# Patient Record
Sex: Female | Born: 1955 | Race: Black or African American | Hispanic: No
Health system: Southern US, Community
[De-identification: ages and names within clinical notes are randomized; demographics above are authoritative.]

## PROBLEM LIST (undated history)

## (undated) ENCOUNTER — Encounter

## (undated) ENCOUNTER — Encounter
Attending: Student in an Organized Health Care Education/Training Program | Primary: Student in an Organized Health Care Education/Training Program

## (undated) ENCOUNTER — Telehealth

## (undated) ENCOUNTER — Ambulatory Visit: Payer: MEDICARE

## (undated) ENCOUNTER — Encounter: Attending: Adult Health | Primary: Adult Health

## (undated) ENCOUNTER — Ambulatory Visit
Payer: MEDICARE | Attending: Student in an Organized Health Care Education/Training Program | Primary: Student in an Organized Health Care Education/Training Program

## (undated) ENCOUNTER — Ambulatory Visit

## (undated) ENCOUNTER — Ambulatory Visit: Payer: MEDICARE | Attending: Adult Health | Primary: Adult Health

## (undated) ENCOUNTER — Ambulatory Visit: Payer: MEDICARE | Attending: Internal Medicine | Primary: Internal Medicine

## (undated) ENCOUNTER — Encounter: Attending: Nurse Practitioner | Primary: Nurse Practitioner

## (undated) ENCOUNTER — Inpatient Hospital Stay

## (undated) ENCOUNTER — Telehealth
Attending: Student in an Organized Health Care Education/Training Program | Primary: Student in an Organized Health Care Education/Training Program

## (undated) ENCOUNTER — Telehealth: Attending: Clinical | Primary: Clinical

---
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
## 2000-05-12 ENCOUNTER — Encounter: Payer: Self-pay | Admitting: Family Medicine

## 2004-08-04 LAB — CONVERTED CEMR LAB: Pap Smear: NORMAL

## 2006-09-08 ENCOUNTER — Emergency Department: Payer: Self-pay | Admitting: Emergency Medicine

## 2006-09-08 ENCOUNTER — Other Ambulatory Visit: Payer: Self-pay

## 2007-09-29 ENCOUNTER — Ambulatory Visit: Payer: Self-pay | Admitting: Family Medicine

## 2007-09-29 DIAGNOSIS — C9311 Chronic myelomonocytic leukemia, in remission: Secondary | ICD-10-CM | POA: Insufficient documentation

## 2007-09-29 DIAGNOSIS — R631 Polydipsia: Secondary | ICD-10-CM

## 2007-09-29 DIAGNOSIS — N959 Unspecified menopausal and perimenopausal disorder: Secondary | ICD-10-CM | POA: Insufficient documentation

## 2007-09-29 DIAGNOSIS — I10 Essential (primary) hypertension: Secondary | ICD-10-CM | POA: Insufficient documentation

## 2007-09-29 DIAGNOSIS — F329 Major depressive disorder, single episode, unspecified: Secondary | ICD-10-CM

## 2007-09-29 DIAGNOSIS — C50919 Malignant neoplasm of unspecified site of unspecified female breast: Secondary | ICD-10-CM | POA: Insufficient documentation

## 2007-09-30 ENCOUNTER — Ambulatory Visit: Payer: Self-pay | Admitting: Family Medicine

## 2007-09-30 DIAGNOSIS — R7402 Elevation of levels of lactic acid dehydrogenase (LDH): Secondary | ICD-10-CM | POA: Insufficient documentation

## 2007-09-30 DIAGNOSIS — E7800 Pure hypercholesterolemia, unspecified: Secondary | ICD-10-CM

## 2007-09-30 DIAGNOSIS — R740 Nonspecific elevation of levels of transaminase and lactic acid dehydrogenase [LDH]: Secondary | ICD-10-CM

## 2007-09-30 LAB — CONVERTED CEMR LAB
AST: 76 units/L — ABNORMAL HIGH (ref 0–37)
Bilirubin, Direct: 0.2 mg/dL (ref 0.0–0.3)
CO2: 24 meq/L (ref 19–32)
Chloride: 108 meq/L (ref 96–112)
Cholesterol: 226 mg/dL (ref 0–200)
Creatinine, Ser: 1.2 mg/dL (ref 0.4–1.2)
Direct LDL: 122.3 mg/dL
Glucose, Bld: 101 mg/dL — ABNORMAL HIGH (ref 70–99)
Sodium: 140 meq/L (ref 135–145)
Total Bilirubin: 1.3 mg/dL — ABNORMAL HIGH (ref 0.3–1.2)
Total Protein: 7.8 g/dL (ref 6.0–8.3)

## 2007-10-01 ENCOUNTER — Ambulatory Visit: Payer: Self-pay | Admitting: Family Medicine

## 2007-10-01 ENCOUNTER — Encounter: Payer: Self-pay | Admitting: Family Medicine

## 2007-10-01 ENCOUNTER — Encounter (INDEPENDENT_AMBULATORY_CARE_PROVIDER_SITE_OTHER): Payer: Self-pay | Admitting: Internal Medicine

## 2007-10-01 ENCOUNTER — Other Ambulatory Visit: Admission: RE | Admit: 2007-10-01 | Discharge: 2007-10-01 | Payer: Self-pay | Admitting: Family Medicine

## 2007-10-01 DIAGNOSIS — G4700 Insomnia, unspecified: Secondary | ICD-10-CM

## 2007-10-06 ENCOUNTER — Encounter (INDEPENDENT_AMBULATORY_CARE_PROVIDER_SITE_OTHER): Payer: Self-pay | Admitting: Internal Medicine

## 2007-10-06 ENCOUNTER — Encounter (INDEPENDENT_AMBULATORY_CARE_PROVIDER_SITE_OTHER): Payer: Self-pay | Admitting: *Deleted

## 2007-10-06 ENCOUNTER — Telehealth (INDEPENDENT_AMBULATORY_CARE_PROVIDER_SITE_OTHER): Payer: Self-pay | Admitting: Internal Medicine

## 2007-10-15 ENCOUNTER — Encounter: Payer: Self-pay | Admitting: Family Medicine

## 2007-10-18 ENCOUNTER — Ambulatory Visit: Payer: Self-pay | Admitting: Family Medicine

## 2007-10-18 LAB — CONVERTED CEMR LAB
Hep B C IgM: NEGATIVE
Hepatitis B Surface Ag: NEGATIVE

## 2007-10-19 LAB — CONVERTED CEMR LAB
Alkaline Phosphatase: 70 units/L (ref 39–117)
Total Bilirubin: 0.8 mg/dL (ref 0.3–1.2)
Total Protein: 7.7 g/dL (ref 6.0–8.3)

## 2007-11-01 ENCOUNTER — Encounter (INDEPENDENT_AMBULATORY_CARE_PROVIDER_SITE_OTHER): Payer: Self-pay | Admitting: *Deleted

## 2013-07-18 ENCOUNTER — Ambulatory Visit: Payer: Self-pay | Admitting: Oncology

## 2013-07-18 ENCOUNTER — Inpatient Hospital Stay: Payer: Self-pay | Admitting: Internal Medicine

## 2013-07-18 LAB — CK TOTAL AND CKMB (NOT AT ARMC)
CK, Total: 40 U/L (ref 21–215)
CK-MB: 1.3 ng/mL (ref 0.5–3.6)

## 2013-07-18 LAB — BASIC METABOLIC PANEL
Anion Gap: 16 (ref 7–16)
BUN: 36 mg/dL — ABNORMAL HIGH (ref 7–18)
Calcium, Total: 8.2 mg/dL — ABNORMAL LOW (ref 8.5–10.1)
Chloride: 111 mmol/L — ABNORMAL HIGH (ref 98–107)
Creatinine: 1.43 mg/dL — ABNORMAL HIGH (ref 0.60–1.30)
EGFR (Non-African Amer.): 40 — ABNORMAL LOW
Glucose: 87 mg/dL (ref 65–99)
Potassium: 4.7 mmol/L (ref 3.5–5.1)
Sodium: 138 mmol/L (ref 136–145)

## 2013-07-18 LAB — RETICULOCYTES
Absolute Retic Count: 0.3048 10*6/uL — ABNORMAL HIGH (ref 0.019–0.186)
Reticulocyte: 15.34 % — ABNORMAL HIGH (ref 0.4–3.1)

## 2013-07-18 LAB — APTT: Activated PTT: 36.3 secs — ABNORMAL HIGH (ref 23.6–35.9)

## 2013-07-18 LAB — HEPATIC FUNCTION PANEL A (ARMC)
Albumin: 2.4 g/dL — ABNORMAL LOW (ref 3.4–5.0)
Bilirubin, Direct: 0.7 mg/dL — ABNORMAL HIGH (ref 0.00–0.20)
Bilirubin,Total: 1.6 mg/dL — ABNORMAL HIGH (ref 0.2–1.0)
Total Protein: 5.9 g/dL — ABNORMAL LOW (ref 6.4–8.2)

## 2013-07-18 LAB — LIPASE, BLOOD: Lipase: 160 U/L (ref 73–393)

## 2013-07-18 LAB — PROTIME-INR: INR: 1.6

## 2013-07-18 LAB — CBC: RDW: 27.1 % — ABNORMAL HIGH (ref 11.5–14.5)

## 2013-07-18 LAB — HEMOGLOBIN
HGB: 3.7 g/dL — CL (ref 12.0–16.0)
HGB: 6.2 g/dL — ABNORMAL LOW (ref 12.0–16.0)

## 2013-07-18 LAB — LACTATE DEHYDROGENASE: LDH: 419 U/L — ABNORMAL HIGH (ref 81–246)

## 2013-07-18 LAB — IRON AND TIBC
Iron Bind.Cap.(Total): 392 ug/dL (ref 250–450)
Iron Saturation: 3 %
Iron: 13 ug/dL — ABNORMAL LOW (ref 50–170)
Unbound Iron-Bind.Cap.: 379 ug/dL

## 2013-07-18 LAB — TROPONIN I: Troponin-I: <0.02 ng/mL

## 2013-07-19 LAB — CBC WITH DIFFERENTIAL/PLATELET
Bands: 9 %
Basophil: 4 %
HCT: 23.2 % — ABNORMAL LOW (ref 35.0–47.0)
HGB: 7.5 g/dL — ABNORMAL LOW (ref 12.0–16.0)
MCH: 23.5 pg — ABNORMAL LOW (ref 26.0–34.0)
MCV: 72 fL — ABNORMAL LOW (ref 80–100)
Myelocyte: 8 %
NRBC/100 WBC: 36 /
Platelet: 110 10*3/uL — ABNORMAL LOW (ref 150–440)
Promyelocyte: 1 %
RBC: 3.21 10*6/uL — ABNORMAL LOW (ref 3.80–5.20)
Segmented Neutrophils: 55 %
WBC: 30.4 10*3/uL — ABNORMAL HIGH (ref 3.6–11.0)

## 2013-07-19 LAB — BASIC METABOLIC PANEL
Chloride: 109 mmol/L — ABNORMAL HIGH (ref 98–107)
Co2: 17 mmol/L — ABNORMAL LOW (ref 21–32)
Creatinine: 1.33 mg/dL — ABNORMAL HIGH (ref 0.60–1.30)
EGFR (Non-African Amer.): 44 — ABNORMAL LOW
Potassium: 3.7 mmol/L (ref 3.5–5.1)
Sodium: 138 mmol/L (ref 136–145)

## 2013-07-19 LAB — PROTIME-INR
INR: 1.2
Prothrombin Time: 15 secs — ABNORMAL HIGH (ref 11.5–14.7)

## 2013-07-19 LAB — LIPID PANEL
Cholesterol: 76 mg/dL (ref 0–200)
HDL Cholesterol: 16 mg/dL — ABNORMAL LOW (ref 40–60)
Triglycerides: 210 mg/dL — ABNORMAL HIGH (ref 0–200)
VLDL Cholesterol, Calc: 42 mg/dL — ABNORMAL HIGH (ref 5–40)

## 2013-07-19 LAB — APTT: Activated PTT: 33.5 secs (ref 23.6–35.9)

## 2013-07-19 LAB — HEMOGLOBIN: HGB: 8.3 g/dL — ABNORMAL LOW (ref 12.0–16.0)

## 2013-07-20 LAB — CBC WITH DIFFERENTIAL/PLATELET
Bands: 8 %
Basophil: 4 %
HCT: 25.6 % — ABNORMAL LOW (ref 35.0–47.0)
HGB: 8.1 g/dL — ABNORMAL LOW (ref 12.0–16.0)
MCH: 22.7 pg — ABNORMAL LOW (ref 26.0–34.0)
MCHC: 31.6 g/dL — ABNORMAL LOW (ref 32.0–36.0)
MCV: 72 fL — ABNORMAL LOW (ref 80–100)
Metamyelocyte: 2 %
Myelocyte: 1 %
NRBC/100 WBC: 22 /
Platelet: 137 10*3/uL — ABNORMAL LOW (ref 150–440)
RBC: 3.56 10*6/uL — ABNORMAL LOW (ref 3.80–5.20)

## 2013-07-20 LAB — BASIC METABOLIC PANEL
Anion Gap: 9 (ref 7–16)
Calcium, Total: 8.4 mg/dL — ABNORMAL LOW (ref 8.5–10.1)
Creatinine: 1.06 mg/dL (ref 0.60–1.30)
EGFR (African American): >60
Glucose: 91 mg/dL (ref 65–99)
Osmolality: 272 (ref 275–301)
Potassium: 3.9 mmol/L (ref 3.5–5.1)
Sodium: 134 mmol/L — ABNORMAL LOW (ref 136–145)

## 2013-07-20 LAB — PROTIME-INR: INR: 1.1

## 2013-07-21 LAB — CBC WITH DIFFERENTIAL/PLATELET
Bands: 2 %
Basophil: 2 %
Eosinophil: 1 %
MCV: 74 fL — ABNORMAL LOW (ref 80–100)
Monocytes: 21 %
Myelocyte: 1 %
NRBC/100 WBC: 20 /
Segmented Neutrophils: 65 %
WBC: 41.3 10*3/uL — ABNORMAL HIGH (ref 3.6–11.0)

## 2013-07-21 LAB — SYNOVIAL CELL COUNT + DIFF, W/ CRYSTALS
Eosinophil: 0 %
Lymphocytes: 4 %
Nucleated Cell Count: 5523 /mm3
Other Cells BF: 0 %
Other Mononuclear Cells: 27 %

## 2013-07-22 LAB — CBC WITH DIFFERENTIAL/PLATELET
Basophil: 1 %
Eosinophil: 2 %
HCT: 30.9 % — ABNORMAL LOW (ref 35.0–47.0)
HGB: 9.7 g/dL — ABNORMAL LOW (ref 12.0–16.0)
Lymphocytes: 10 %
MCH: 23.3 pg — ABNORMAL LOW (ref 26.0–34.0)
Myelocyte: 4 %
NRBC/100 WBC: 9 /
RDW: 27.4 % — ABNORMAL HIGH (ref 11.5–14.5)
WBC: 37.3 10*3/uL — ABNORMAL HIGH (ref 3.6–11.0)

## 2013-07-24 LAB — CBC WITH DIFFERENTIAL/PLATELET
Bands: 10 %
HCT: 32.2 % — ABNORMAL LOW (ref 35.0–47.0)
HGB: 9.9 g/dL — ABNORMAL LOW (ref 12.0–16.0)
MCH: 23.3 pg — ABNORMAL LOW (ref 26.0–34.0)
MCHC: 30.7 g/dL — ABNORMAL LOW (ref 32.0–36.0)
MCV: 76 fL — ABNORMAL LOW (ref 80–100)
Monocytes: 8 %
NRBC/100 WBC: 2 /
Platelet: 139 10*3/uL — ABNORMAL LOW (ref 150–440)
RBC: 4.26 10*6/uL (ref 3.80–5.20)
RDW: 27.6 % — ABNORMAL HIGH (ref 11.5–14.5)
Variant Lymphocyte - H1-Rlymph: 1 %

## 2013-07-24 LAB — BASIC METABOLIC PANEL
Anion Gap: 7 (ref 7–16)
BUN: 18 mg/dL (ref 7–18)
Calcium, Total: 8.5 mg/dL (ref 8.5–10.1)
EGFR (Non-African Amer.): >60
Glucose: 102 mg/dL — ABNORMAL HIGH (ref 65–99)
Osmolality: 276 (ref 275–301)
Sodium: 137 mmol/L (ref 136–145)

## 2013-07-25 LAB — CBC WITH DIFFERENTIAL/PLATELET
Bands: 9 %
Lymphocytes: 9 %
MCH: 22.9 pg — ABNORMAL LOW (ref 26.0–34.0)
MCHC: 30.6 g/dL — ABNORMAL LOW (ref 32.0–36.0)
MCV: 75 fL — ABNORMAL LOW (ref 80–100)
Metamyelocyte: 2 %
Monocytes: 14 %
Myelocyte: 2 %
NRBC/100 WBC: 1 /
Platelet: 167 10*3/uL (ref 150–440)
Promyelocyte: 1 %
RBC: 4.47 10*6/uL (ref 3.80–5.20)
RDW: 27.8 % — ABNORMAL HIGH (ref 11.5–14.5)
WBC: 30.3 10*3/uL — ABNORMAL HIGH (ref 3.6–11.0)

## 2013-08-04 ENCOUNTER — Ambulatory Visit: Payer: Self-pay | Admitting: Oncology

## 2013-08-15 ENCOUNTER — Ambulatory Visit: Payer: Self-pay | Admitting: Oncology

## 2013-08-15 LAB — CBC CANCER CENTER
BASOS ABS: 2 %
Bands: 15 %
Blast: 2 %
Eosinophil: 1 %
HCT: 32.6 % — AB (ref 35.0–47.0)
HGB: 10.2 g/dL — ABNORMAL LOW (ref 12.0–16.0)
Lymphocytes: 11 %
MCH: 23.6 pg — AB (ref 26.0–34.0)
MCHC: 31.3 g/dL — ABNORMAL LOW (ref 32.0–36.0)
MCV: 75 fL — AB (ref 80–100)
METAMYELOCYTE: 3 %
Monocytes: 10 %
Myelocyte: 3 %
NRBC/100 WBC: 3 /100
Other Cells Blood: 1 %
PLATELETS: 457 x10 3/mm — AB (ref 150–440)
RBC: 4.34 10*6/uL (ref 3.80–5.20)
RDW: 28 % — ABNORMAL HIGH (ref 11.5–14.5)
Segmented Neutrophils: 52 %
WBC: 21.7 x10 3/mm — AB (ref 3.6–11.0)

## 2013-08-15 LAB — IRON AND TIBC
IRON BIND. CAP.(TOTAL): 428 ug/dL (ref 250–450)
IRON SATURATION: 11 %
IRON: 47 ug/dL — AB (ref 50–170)
UNBOUND IRON-BIND. CAP.: 381 ug/dL

## 2013-08-15 LAB — LACTATE DEHYDROGENASE: LDH: 268 U/L — ABNORMAL HIGH (ref 81–246)

## 2013-08-15 LAB — FERRITIN: Ferritin (ARMC): 76 ng/mL (ref 8–388)

## 2013-09-04 ENCOUNTER — Ambulatory Visit: Payer: Self-pay | Admitting: Oncology

## 2013-09-22 ENCOUNTER — Ambulatory Visit: Payer: Self-pay | Admitting: Pain Medicine

## 2013-09-22 LAB — BASIC METABOLIC PANEL
ANION GAP: 7 (ref 7–16)
BUN: 14 mg/dL (ref 7–18)
CO2: 24 mmol/L (ref 21–32)
CREATININE: 0.95 mg/dL (ref 0.60–1.30)
Calcium, Total: 9.6 mg/dL (ref 8.5–10.1)
Chloride: 110 mmol/L — ABNORMAL HIGH (ref 98–107)
EGFR (African American): >60
EGFR (Non-African Amer.): >60
Glucose: 89 mg/dL (ref 65–99)
OSMOLALITY: 281 (ref 275–301)
Potassium: 4 mmol/L (ref 3.5–5.1)
SODIUM: 141 mmol/L (ref 136–145)

## 2013-09-22 LAB — HEPATIC FUNCTION PANEL A (ARMC)
ALK PHOS: 248 U/L — AB
AST: 30 U/L (ref 15–37)
Albumin: 4.3 g/dL (ref 3.4–5.0)
BILIRUBIN DIRECT: 0.3 mg/dL — AB (ref 0.00–0.20)
BILIRUBIN TOTAL: 1.1 mg/dL — AB (ref 0.2–1.0)
SGPT (ALT): 103 U/L — ABNORMAL HIGH (ref 12–78)
TOTAL PROTEIN: 8.2 g/dL (ref 6.4–8.2)

## 2013-09-22 LAB — MAGNESIUM: Magnesium: 1.9 mg/dL

## 2013-09-22 LAB — SEDIMENTATION RATE: Erythrocyte Sed Rate: 2 mm/hr (ref 0–30)

## 2013-09-26 ENCOUNTER — Ambulatory Visit: Payer: Self-pay | Admitting: Oncology

## 2013-09-26 LAB — CBC CANCER CENTER
BLAST: 1 %
Bands: 11 %
Basophil: 4 %
Eosinophil: 2 %
HCT: 32.6 % — ABNORMAL LOW (ref 35.0–47.0)
HGB: 10.2 g/dL — ABNORMAL LOW (ref 12.0–16.0)
Lymphocytes: 10 %
MCH: 23.1 pg — AB (ref 26.0–34.0)
MCHC: 31.2 g/dL — ABNORMAL LOW (ref 32.0–36.0)
MCV: 74 fL — ABNORMAL LOW (ref 80–100)
MONOS PCT: 5 %
Metamyelocyte: 2 %
Myelocyte: 9 %
PLATELETS: 785 x10 3/mm — AB (ref 150–440)
RBC: 4.4 10*6/uL (ref 3.80–5.20)
RDW: 21.3 % — ABNORMAL HIGH (ref 11.5–14.5)
Segmented Neutrophils: 52 %
Variant Lymphocyte: 4 %
WBC: 28.7 x10 3/mm — ABNORMAL HIGH (ref 3.6–11.0)

## 2013-09-26 LAB — LACTATE DEHYDROGENASE: LDH: 252 U/L — AB (ref 81–246)

## 2013-09-28 ENCOUNTER — Ambulatory Visit: Payer: Self-pay | Admitting: Unknown Physician Specialty

## 2013-10-02 ENCOUNTER — Ambulatory Visit: Payer: Self-pay | Admitting: Oncology

## 2013-10-03 LAB — PATHOLOGY REPORT

## 2013-10-26 ENCOUNTER — Ambulatory Visit: Payer: Self-pay | Admitting: Oncology

## 2013-10-26 ENCOUNTER — Ambulatory Visit: Payer: Self-pay | Admitting: Pain Medicine

## 2013-10-26 LAB — CBC CANCER CENTER
Basophil #: 0 x10 3/mm (ref 0.0–0.1)
Basophil %: 1 %
EOS ABS: 0 x10 3/mm (ref 0.0–0.7)
Eosinophil %: 0.8 %
HCT: 30.7 % — ABNORMAL LOW (ref 35.0–47.0)
HGB: 9.7 g/dL — ABNORMAL LOW (ref 12.0–16.0)
LYMPHS ABS: 0.7 x10 3/mm — AB (ref 1.0–3.6)
Lymphocyte %: 28.8 %
MCH: 23.8 pg — ABNORMAL LOW (ref 26.0–34.0)
MCHC: 31.5 g/dL — ABNORMAL LOW (ref 32.0–36.0)
MCV: 76 fL — ABNORMAL LOW (ref 80–100)
Monocyte #: 0.2 x10 3/mm (ref 0.2–0.9)
Monocyte %: 6.8 %
NEUTROS ABS: 1.6 x10 3/mm (ref 1.4–6.5)
NEUTROS PCT: 62.6 %
Platelet: 132 x10 3/mm — ABNORMAL LOW (ref 150–440)
RBC: 4.06 10*6/uL (ref 3.80–5.20)
RDW: 21.6 % — AB (ref 11.5–14.5)
WBC: 2.5 x10 3/mm — ABNORMAL LOW (ref 3.6–11.0)

## 2013-11-01 ENCOUNTER — Ambulatory Visit: Payer: Self-pay | Admitting: Pain Medicine

## 2013-11-02 ENCOUNTER — Ambulatory Visit: Payer: Self-pay | Admitting: Oncology

## 2013-11-11 ENCOUNTER — Ambulatory Visit: Payer: Self-pay | Admitting: Oncology

## 2013-11-11 LAB — CBC CANCER CENTER
BASOS ABS: 0 x10 3/mm (ref 0.0–0.1)
Basophil %: 1.4 %
EOS ABS: 0 x10 3/mm (ref 0.0–0.7)
Eosinophil %: 0.6 %
HCT: 31.1 % — ABNORMAL LOW (ref 35.0–47.0)
HGB: 10 g/dL — AB (ref 12.0–16.0)
LYMPHS ABS: 1.6 x10 3/mm (ref 1.0–3.6)
LYMPHS PCT: 57.6 %
MCH: 25.1 pg — ABNORMAL LOW (ref 26.0–34.0)
MCHC: 32.1 g/dL (ref 32.0–36.0)
MCV: 78 fL — ABNORMAL LOW (ref 80–100)
MONO ABS: 0.1 x10 3/mm — AB (ref 0.2–0.9)
MONOS PCT: 5.1 %
Neutrophil #: 1 x10 3/mm — ABNORMAL LOW (ref 1.4–6.5)
Neutrophil %: 35.3 %
Platelet: 94 x10 3/mm — ABNORMAL LOW (ref 150–440)
RBC: 3.99 10*6/uL (ref 3.80–5.20)
RDW: 24.2 % — ABNORMAL HIGH (ref 11.5–14.5)
WBC: 2.8 x10 3/mm — AB (ref 3.6–11.0)

## 2013-11-18 ENCOUNTER — Ambulatory Visit: Payer: Self-pay | Admitting: Internal Medicine

## 2013-11-18 LAB — PROTIME-INR
INR: 1
PROTHROMBIN TIME: 12.7 s (ref 11.5–14.7)

## 2013-11-18 LAB — HEPATIC FUNCTION PANEL A (ARMC)
ALBUMIN: 3.6 g/dL (ref 3.4–5.0)
ALK PHOS: 243 U/L — AB
AST: 32 U/L (ref 15–37)
Bilirubin, Direct: 0.4 mg/dL — ABNORMAL HIGH (ref 0.00–0.20)
Bilirubin,Total: 1.2 mg/dL — ABNORMAL HIGH (ref 0.2–1.0)
SGPT (ALT): 31 U/L (ref 12–78)
TOTAL PROTEIN: 7.2 g/dL (ref 6.4–8.2)

## 2013-11-18 LAB — HEMOGLOBIN A1C: HEMOGLOBIN A1C: 5.3 % (ref 4.2–6.3)

## 2013-11-18 LAB — APTT: ACTIVATED PTT: 30.1 s (ref 23.6–35.9)

## 2013-11-22 ENCOUNTER — Other Ambulatory Visit: Payer: Self-pay | Admitting: Pain Medicine

## 2013-11-22 ENCOUNTER — Other Ambulatory Visit: Payer: Self-pay | Admitting: Unknown Physician Specialty

## 2013-11-22 LAB — HEPATIC FUNCTION PANEL A (ARMC)
ALK PHOS: 252 U/L — AB
ALT: 32 U/L (ref 12–78)
Albumin: 3.9 g/dL (ref 3.4–5.0)
Bilirubin, Direct: 0.4 mg/dL — ABNORMAL HIGH (ref 0.00–0.20)
Bilirubin,Total: 1.4 mg/dL — ABNORMAL HIGH (ref 0.2–1.0)
SGOT(AST): 25 U/L (ref 15–37)
Total Protein: 7.9 g/dL (ref 6.4–8.2)

## 2013-11-22 LAB — BASIC METABOLIC PANEL
ANION GAP: 5 — AB (ref 7–16)
BUN: 8 mg/dL (ref 7–18)
CHLORIDE: 108 mmol/L — AB (ref 98–107)
Calcium, Total: 9.3 mg/dL (ref 8.5–10.1)
Co2: 25 mmol/L (ref 21–32)
Creatinine: 0.88 mg/dL (ref 0.60–1.30)
EGFR (Non-African Amer.): >60
Glucose: 91 mg/dL (ref 65–99)
OSMOLALITY: 274 (ref 275–301)
POTASSIUM: 3.9 mmol/L (ref 3.5–5.1)
Sodium: 138 mmol/L (ref 136–145)

## 2013-11-23 ENCOUNTER — Ambulatory Visit: Payer: Self-pay | Admitting: Pain Medicine

## 2013-11-24 ENCOUNTER — Other Ambulatory Visit (HOSPITAL_COMMUNITY): Payer: Self-pay | Admitting: *Deleted

## 2013-11-24 DIAGNOSIS — R609 Edema, unspecified: Secondary | ICD-10-CM

## 2013-11-25 ENCOUNTER — Other Ambulatory Visit (INDEPENDENT_AMBULATORY_CARE_PROVIDER_SITE_OTHER): Payer: Medicare Other

## 2013-11-25 ENCOUNTER — Other Ambulatory Visit: Payer: Self-pay

## 2013-11-25 DIAGNOSIS — R609 Edema, unspecified: Secondary | ICD-10-CM

## 2013-11-25 DIAGNOSIS — R0602 Shortness of breath: Secondary | ICD-10-CM

## 2013-11-29 ENCOUNTER — Ambulatory Visit: Payer: Self-pay | Admitting: Pain Medicine

## 2013-12-12 ENCOUNTER — Other Ambulatory Visit: Payer: Self-pay | Admitting: Rheumatology

## 2013-12-12 ENCOUNTER — Ambulatory Visit: Payer: Self-pay | Admitting: Oncology

## 2013-12-12 LAB — CBC CANCER CENTER
Basophil #: 0 x10 3/mm (ref 0.0–0.1)
Basophil %: 0.1 %
EOS ABS: 0 x10 3/mm (ref 0.0–0.7)
Eosinophil %: 0.1 %
HCT: 28.2 % — AB (ref 35.0–47.0)
HGB: 9.2 g/dL — ABNORMAL LOW (ref 12.0–16.0)
Lymphocyte #: 0.5 x10 3/mm — ABNORMAL LOW (ref 1.0–3.6)
Lymphocyte %: 28.1 %
MCH: 27.7 pg (ref 26.0–34.0)
MCHC: 32.6 g/dL (ref 32.0–36.0)
MCV: 85 fL (ref 80–100)
Monocyte #: 0.2 x10 3/mm (ref 0.2–0.9)
Monocyte %: 9.3 %
Neutrophil #: 1.2 x10 3/mm — ABNORMAL LOW (ref 1.4–6.5)
Neutrophil %: 62.4 %
Platelet: 53 x10 3/mm — ABNORMAL LOW (ref 150–440)
RBC: 3.32 10*6/uL — ABNORMAL LOW (ref 3.80–5.20)
RDW: 29 % — ABNORMAL HIGH (ref 11.5–14.5)
WBC: 1.9 x10 3/mm — AB (ref 3.6–11.0)

## 2013-12-12 LAB — IRON AND TIBC
IRON BIND. CAP.(TOTAL): 462 ug/dL — AB (ref 250–450)
IRON SATURATION: 23 %
Iron: 106 ug/dL (ref 50–170)
Unbound Iron-Bind.Cap.: 356 ug/dL

## 2013-12-12 LAB — CREATININE, SERUM
Creatinine: 0.9 mg/dL (ref 0.60–1.30)
EGFR (African American): >60

## 2013-12-12 LAB — COMPREHENSIVE METABOLIC PANEL
ALBUMIN: 4.3 g/dL (ref 3.4–5.0)
ALK PHOS: 188 U/L — AB
ANION GAP: 12 (ref 7–16)
BUN: 9 mg/dL (ref 7–18)
Bilirubin,Total: 1.4 mg/dL — ABNORMAL HIGH (ref 0.2–1.0)
CALCIUM: 9.3 mg/dL (ref 8.5–10.1)
CHLORIDE: 103 mmol/L (ref 98–107)
CREATININE: 0.98 mg/dL (ref 0.60–1.30)
Co2: 24 mmol/L (ref 21–32)
EGFR (African American): >60
EGFR (Non-African Amer.): >60
Glucose: 119 mg/dL — ABNORMAL HIGH (ref 65–99)
OSMOLALITY: 277 (ref 275–301)
POTASSIUM: 4.3 mmol/L (ref 3.5–5.1)
SGOT(AST): 25 U/L (ref 15–37)
SGPT (ALT): 45 U/L (ref 12–78)
Sodium: 139 mmol/L (ref 136–145)
Total Protein: 8.1 g/dL (ref 6.4–8.2)

## 2013-12-12 LAB — FERRITIN: FERRITIN (ARMC): 26 ng/mL (ref 8–388)

## 2013-12-12 LAB — URIC ACID: Uric Acid: 3.2 mg/dL (ref 2.6–6.0)

## 2013-12-21 ENCOUNTER — Ambulatory Visit: Payer: Self-pay | Admitting: Pain Medicine

## 2014-01-03 ENCOUNTER — Ambulatory Visit: Payer: Self-pay | Admitting: Pain Medicine

## 2014-01-04 ENCOUNTER — Ambulatory Visit: Payer: Self-pay | Admitting: Oncology

## 2014-01-27 ENCOUNTER — Ambulatory Visit: Payer: Self-pay | Admitting: Pain Medicine

## 2014-04-26 ENCOUNTER — Ambulatory Visit: Payer: Self-pay | Admitting: Pain Medicine

## 2014-11-24 NOTE — Consult Note (Signed)
Brief Consult Note: Patient was seen by consultant.   Consult note dictated.   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 less likely RA or leukemia related  2. severe neck pain with flexion ?crystalline but cannot r/o leukemia related meningitis,,,disciitis etc        Rt knee aspirated of 3cc inflammatory fliud, sent for microscopy.if uric acid crystals seen, then would begin allopurinol 100mg  qd if ok with hematology. agree with steriods for  her synovitis,. rec cx spine xrays and MRI amd further wup of her neck as indicated.  Electronic Signatures: Leeanne Mannan., Eugene Gavia (MD)  (Signed 18-Dec-14 19:47)  Authored: Brief Consult Note   Last Updated: 18-Dec-14 19:47 by Leeanne Mannan., Eugene Gavia (MD)

## 2014-11-24 NOTE — Discharge Summary (Signed)
PATIENT NAME:  [name], PEITZ MR#:  [id number] DATE OF BIRTH:  [birth year]  DATE OF ADMISSION:  07/18/2013 DATE OF DISCHARGE:  07/27/2013  ADDENDUM  Please refer to discharge summary done yesterday regarding patient's discharge. Please note due to the patient unable to get a PASSAR, she had to stay in the hospital. Today she has been able to receive her PASSAR, so she is stable for discharge. Please note that the _____  evaluated her this morning and blood pressure was slightly elevated, therefore, Norvasc 10 mg added to her discharge medication in addition to the acetaminophen 325 q.4 p.r.n., oxycodone 5 mg q.8 p.r.n. for pain, allopurinol 300 daily, metoprolol tartrate 25 mg 1 tab p.o. b.i.d., bacitracin topically affected 3 times a day to nostril, sucralfate 1 gram 10 mL t.i.d., oxymetazoline nasal spray q.4 as needed for bleeding, Protonix 40 mg 1 tab p.o. b.i.d., prednisone taper 60 mg, taper 10 mg until complete, colchicine 0.6 daily.   ACTIVITY: As tolerated.   FOLLOWUP: With primary M.D. in 1 to 2 weeks. Follow up with Dr. Grayland Ormond of oncology in 2 to 4 weeks. Dr. Jefm Bryant in 2 to 4 weeks, Dr. Vira Agar in six weeks.  PLEASE NOTE TIME SPENT: 35 minutes on this discharge.    ____________________________ Lafonda Mosses. Posey Pronto, MD shp:sg D: 07/27/2013 08:57:36 ET T: 07/27/2013 92:11:94 ET JOB#: [id number]  cc: Kue Fox H. Posey Pronto, MD, <Dictator> Alric Seton MD ELECTRONICALLY SIGNED 07/29/2013 16:03

## 2014-11-24 NOTE — Consult Note (Signed)
Pt doing well, no abd pain, good appetite, Hgb stable.  She is likely going to skilled nursing/rehab for a while.  Needs to see me in office in 2-3 weeks.  GI will sign off, call on call GI if needed.  Electronic Signatures: Manya Silvas (MD)  (Signed on 23-Dec-14 17:47)  Authored  Last Updated: 23-Dec-14 17:47 by Manya Silvas (MD)

## 2014-11-24 NOTE — H&P (Signed)
PATIENT NAME:  [name], [name] MR#:  [id number] DATE OF BIRTH:  1955/11/10  DATE OF ADMISSION:  07/18/2013  PRIMARY CARE PHYSICIAN: None.  CHIEF COMPLAINT: "I can't breathe."    HISTORY OF PRESENT ILLNESS: This is a 59 year old female with history of leukemia. 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 She used to be followed at [facility]. She also has a history of hypertension, anemia, TIA, and history of breast cancer in the past. She presents with weakness that has been going on for a while, trouble breathing, chest pain, tightness in the chest, 7 out of 10 in intensity that comes and goes. Yesterday felt it pretty severe lasting most of the day. She feels numbness on her legs and arms. She cannot stand up. Her feet and hands are freezing. Her bones hurt. She has a headache. She is thirsty. She also has abdominal pain. She was vomiting up greenish material the other day. Had a gold looking bowel movement today, but 1 week ago did have some black stools. This morning she ended up passing out in the doorway and was out cold for a little while waking up on the floor. Hospitalist services were contacted for admission when a hemoglobin was found to be 2.9 in the Emergency Room.   PAST MEDICAL HISTORY: CML, hypertension, anemia, TIA, breast cancer status post chemoradiation, and lumpectomy.   PAST SURGICAL HISTORY: Lumpectomy and port placement.   ALLERGIES: No known drug allergies.   MEDICATIONS: Include Excedrin or Goody Powder 4 to 5 times per day.   SOCIAL HISTORY: Does drink alcohol 2 to 3 times per week and drinks a 1/2 cup of gin. No smoking. No drug use. Lives with her daughter. Used to be a flight attendant in the past.   FAMILY HISTORY: Father died in his 19s of a MI. Mother died of a PE after hip replacement surgery. Brothers and sisters with diabetes and hypertension   REVIEW OF SYSTEMS:  CONSTITUTIONAL: Positive for cold chills and sweats. Positive for weakness.  EYES: Decreased vision. Needs new glasses.   EARS, NOSE, MOUTH AND THROAT: Decreased hearing. Positive for runny nose. No sore throat. No difficulty swallowing.  CARDIOVASCULAR: Positive for chest pain.  RESPIRATORY: Positive for shortness of breath. Positive for cough, clear phlegm.  GASTROINTESTINAL: Positive for vomiting greenish phlegm material the other day. Positive for abdominal pain. Positive for dark stools a week ago.  GENITOURINARY: Positive for rusty colored urine.  MUSCULOSKELETAL: Positive for joint pains all over.  INTEGUMENT: No rashes or eruptions.  NEUROLOGIC: Positive for fainting.  PSYCHIATRIC: Positive for anxiety.  ENDOCRINE: No thyroid problems.  HEMATOLOGIC AND LYMPHATIC: Positive for anemia.   PHYSICAL EXAMINATION: VITAL SIGNS: Temperature 98.3, pulse 92, respirations 20, blood pressure 117/54, and pulse ox 100% on oxygen. On presentation to the ER, the patient's pulse was 104 and respirations 28.  GENERAL: Pale female in no respiratory distress, lying flat in bed.  EYES: Conjunctivae pale, lids normal. Pupils equal, round, and reactive to light. Extraocular muscles intact. No nystagmus.  EARS, NOSE, MOUTH AND THROAT: Tympanic membranes: No erythema. Nasal mucosa: No erythema. Throat: No erythema. No exudate seen. Lips and gums: No lesions.  NECK: No JVD. No bruits. No lymphadenopathy. No thyromegaly. No thyroid nodules palpated.  RESPIRATORY: Lungs clear to auscultation. No use of accessory muscles to breathe. No rhonchi, rales, or wheeze heard.  CARDIOVASCULAR: S1 and S2 normal. No gallops, rubs, or murmurs heard. Carotid upstroke 2+ bilaterally. No bruits. Dorsalis pedis pulses 2+ bilaterally. Trace edema of the  lower extremities.  ABDOMEN: Soft, nontender. No organosplenomegaly. Normoactive bowel sounds. No masses felt.  LYMPHATIC: No lymph nodes in the neck.  MUSCULOSKELETAL: Trace edema. No clubbing. No cyanosis.  SKIN: No ulcers or lesions seen.  NEUROLOGIC: Cranial nerves II through XII grossly intact.  Deep tendon reflexes 1+ bilateral lower extremities.  PSYCHIATRIC: The patient is oriented to person, place, and time.   LABORATORY AND RADIOLOGICAL DATA: EKG shows sinus tachycardia, left atrial enlargement.   Chest x-ray: Left lower lobe scarring. No acute cardiopulmonary.  White blood cell count 78.7, hemoglobin 2.9, hematocrit 14, platelet count of 214, and MCV 72. I added on iron studies and PT, INR and PTT. PT is 18.9, INR 1.6, and PTT 36.2. LDH 419, retic count 15.34, ferritin 16, TIBC 392, iron serum 13, and iron saturation 3%. Lipase 160. Glucose 87, BUN 36, creatinine 1.43, sodium 138, potassium 4.7, chloride 111, CO2 11, and calcium 8.2. Liver function tests: Total bilirubin 1.6, alkaline phosphatase 371, ALT 12, AST 38, and albumin 2.4. Troponin negative.   ASSESSMENT AND PLAN: 1.  Severe blood loss anemia, likely a chronic upper gastrointestinal bleed. We will admit to the CCU secondary to severe anemia. I have not seen a hemoglobin this low before. The patient will be kept n.p.o. Will transfuse 2 units of packed red blood cells. I will have the nurse to try to access the port. If not further IV access will be needed since the patient only has an EJ line. Need to stop [employer]. Will get gastroenterology consultation. The patient put on Protonix drip. Since the patient does drink alcohol, will also put on octreotide pushes.  2.  History of alcohol. Not sure if she will go into withdrawal, but I will watch closely and put on CIWA protocol. Will give 1 dose of IV thiamine.  3.  History of chronic myeloid leukemia. White count is very elevated. I do not have records here. Case discussed with Dr. Grayland Ormond, oncology, to evaluate the patient. I will cancel the CT scan that was ordered by the ER physician secondary to the patient having such a low hemoglobin. I will send off a differential.  4.  Acute renal failure. Likely secondary to gastrointestinal bleed. We will continue to monitor.  5.   Increased liver function tests. Could be secondary to alcohol abuse. Will continue to monitor closely.  6.  Coagulopathy. Will give vitamin K and see if that improves. Could be secondary to liver disease.   TIME SPENT ON ADMISSION: 60 minutes critical care time.   ____________________________ Tana Conch. Leslye Peer, MD rjw:sb D: 07/18/2013 16:31:38 ET T: 07/18/2013 17:02:51 ET JOB#: [id number]  cc: Tana Conch. Leslye Peer, MD, <Dictator> Marisue Brooklyn MD ELECTRONICALLY SIGNED 07/22/2013 17:02

## 2014-11-24 NOTE — Consult Note (Signed)
Pt color looking better, nasal packing removed by ENT doctor today.  Plan to do EGD tomorrow about 11 am if anesthesia can go at that time. Tol mechanical soft diet well.  If EGD neg definitely need to do  colonoscopy this admission.    Electronic Signatures: Manya Silvas (MD)  (Signed on 20-Dec-14 13:29)  Authored  Last Updated: 20-Dec-14 13:29 by Manya Silvas (MD)

## 2014-11-24 NOTE — Consult Note (Signed)
PATIENT NAME:  [name], [name] MR#:  [id number] DATE OF BIRTH:  10-Feb-1956  DATE OF CONSULTATION:  07/21/2013  REFERRING PHYSICIAN:  Dr. Galen Daft. CONSULTING PHYSICIAN:  Meda Klinefelter., MD  REASON FOR CONSULTATION: Arthritis.   HISTORY OF PRESENT ILLNESS: A 59 year old African American female. In 2005, she was diagnosed with CML. She started treatment in 2006. She had chemotherapy and then [place].  She stopped it about 2010  because she lost insurance, and could no longer afford it. She has been without treatment since that time. She moved to [place] and then moved back. She has had progressive weakness.  In the last several months, she has had a good deal of joint pain, developed nodules over the right elbow. She had episodic swelling in her feet; sometimes she would swell in her hands. Sometimes the feet would swell and she could not even touch them with a sheet they would be so sore.  She has had pain in the wrist, and them more recently in the neck, She had an episode of near syncope with shortness of breath,  was brought to the Emergency Room with hemoglobin of 2. She had some melena. She has been transfused, feels better. Joints are bothering her, particularly neck, elbow, knee, right foot and both hands. She has a uric acid of 14.9, normal creatinine at 1.0. After transfusion, her hemoglobin is up to 10.2. White count was 78,000, now 41,000. Platelets are 107,000.   PAST MEDICAL HISTORY:  Breast cancer, hypertension, CML, GI bleed, regular alcohol.  PAST SURGICAL HISTORY:  PIC line placement.   FAMILY HISTORY: Negative for connective tissue disease. Negative for leukemia.   REVIEW OF SYSTEMS: No real chest pain. Hurts to move the neck back and forth, though she can move it side to side. Has had some headache. No definite fever.  No abdominal pain. Has nosebleeds and has had recent packing.    PHYSICAL EXAMINATION: GENERAL: Pleasant female, seen lying in bed with a neck  pillow.  VITAL SIGNS: Temperature 98, blood pressure 167/89, respiratory rate 18, pulse 87.  HEENT:  Sclerae are mildly pale, and nares are packed.  Oropharynx is clear.  NECK:  She does not want to flex the neck, though she can rotate it. LUNGS: Distant lung sounds.  CARDIOVASCULAR:  Trace edema.  MUSCULOSKELETAL: Decreased range of her cervical spine. Both shoulders move reasonably. Right elbow has synovitis with olecranon bursa nodules and olecranon bursal sac and inflammation. The left elbow has synovitis. She has synovitis in the dorsum of the right wrist and several of the MCPs and PIPs. Both hands have synovitis. Hips move reasonably. Right knee with effusion. The first MTP synovitis on the right but not the left. Ankles move well.   PROCEDURE: The right knee was prepped in a sterile manner. I aspirated 3 mL of inflammatory fluid, sent for microscopy.   IMPRESSION: 1.  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 This is less likely concomitant rheumatoid arthritis, or it could be related all to her leukemia. Doubt infectious process. She does have profound hyperuricemia in the setting of her leukemia.  2.  Neck pain out of proportion to her back or other large joints. Always worry about leukemia involvement or diskitis. Could be crystalline, though this is less likely to occur in the spine, but it is possible.  3.  Significant leukemia.  4.  Anemia, suspect multifactorial. Risk for nonsteroidals.   PLAN:  1.  Agree with IV steroid to calm down  her synovitis.  2.  If fluid confirms uric acid crystals, would start allopurinol 100 mg p.o. daily, if agreeable with oncology.  3.  Would work up her cervical spine with MRI and make sure she does not need lumbar puncture. Make sure there is no evidence of leukemia or diskitis involving the neck.    ____________________________ Meda Klinefelter., MD gwk:dmm D: 07/21/2013 19:53:46 ET T: 07/21/2013 20:24:30  ET JOB#: [id number]  cc: Meda Klinefelter., MD, <Dictator> Ovidio Hanger MD ELECTRONICALLY SIGNED 07/22/2013 12:55

## 2014-11-24 NOTE — Consult Note (Signed)
PATIENT NAME:  [name], [name] MR#:  [id number] DATE OF BIRTH:  May 19, 1956  DATE OF CONSULTATION:  07/20/2013  REFERRING PHYSICIAN:  Loletha Grayer, MD CONSULTING PHYSICIAN:  Sammuel Hines. Richardson Landry, MD  REASON FOR CONSULTATION: Epistaxis.   HISTORY OF PRESENT ILLNESS: This 59 year old female is admitted to the hospital for work-up of gastrointestinal bleeding as well as having had chest pain and shortness of breath. She has a history of leukemia, possibly CML, followed at [facility]. She also has a history of hypertension, anemia, TIA, and prior breast cancer. She apparently is not on any regular blood pressure medications, but they have had some prior treatment for hypertension, which apparently was difficult to control in the past.   She began having a nose bleed from the left side this morning. Her blood pressure this morning was 166/74 when measured. She had been on Goody powders and Excedrin 4 to 5 times a day prior to her admission. She has not had frequent epistaxis in the past.   PAST MEDICAL HISTORY: As above.   PAST SURGICAL HISTORY: Lumpectomy and port placement for chemotherapy.   ALLERGIES: None.   MEDICATIONS: Pantoprazole drip, Tylenol 650 mg p.o. every 4 hours p.r.n., folate 1 mg p.o. daily, Ativan 2 mg IM every q.1 hour p.r.n. anxiety, Ativan 2 mg p.o. q.1 hour p.r.n. anxiety, magnesium oxide 400 mg daily, multivitamin 1 daily, octreotide injection 100 mcg IV push q.6 hours, Zofran 4 mg IV push q.4 hours, vitamin K injection 5 mg subcutaneous daily, Phenergan 25 mg per rectum q.4 hours p.r.n. nausea and vomiting, thiamine 50 mg p.o. daily.   SOCIAL HISTORY: She does drink alcohol 2 to 3 times a week. She is a nonsmoker currently.   FAMILY HISTORY: Father died in his 98s of MI. Mother died of a PE after hip replacement surgery. Brothers and sisters with diabetes and hypertension.   REVIEW OF SYSTEMS: Positive for weakness, decreased vision, decreased hearing. She has some chronic  rhinorrhea. No difficulty swallowing. Around the time of her admission, she has had some diarrhea, dark stools and vomiting. She has chronic joint pain. No rashes. She does have anxiety. No history of thyroid problems.   PHYSICAL EXAMINATION:  VITAL SIGNS: Temperature 96.9, pulse 83, blood pressure 166/74.  GENERAL: Well-developed, well-nourished female sitting in bed with a nose clamp on. Initially there was no active bleeding.   HEAD AND FACE: Head is normocephalic, atraumatic. No facial skin lesions. Facial strength is normal and symmetric.  EARS: External ears are unremarkable. Ear canals have some cerumen obscuring the tympanic membranes but no sign of infection.  NOSE: External nose unremarkable. Nasal cavity revealed blood clots on the left nasal cavity. Initially there was no active bleeding. However, the blood clots were suctioned out. She immediately began having regular flow of blood down her throat and to some degree anteriorly. I could not visualize the source of the bleeding, however. It appeared to be more posterior. The right nasal cavity is clear.  ORAL CAVITY AND OROPHARYNX: Lips, gums, tongue, floor of mouth are unremarkable. There is bleeding down the posterior pharynx.  NECK: Supple without adenopathy or mass. There is no thyromegaly.   ASSESSMENT: Epistaxis, which appears to be coming from a posterior source. She has untreated hypertension which may be a contributing factor. I reviewed her labs and, while her platelet count is low, it should be adequate at 137. Her INR is 1.1.   PLAN: I put a 10 cm pack in the left nasal cavity. There was  some oozing around the pack but it controlled most of the heavy bleeding and there was no further bleeding down the back of the throat. This pack will need to be left in place for a minimum of 3 days. It can then be removed. She will need to be on antibiotic prophylaxis while the pack is in place for prevention of toxic shock. I will put her on  doxycycline for this. There is some oozing around the pack, and I would recommend getting her blood pressure down to control this. She has a history of stroke and chronic hypertension so there is no reason she should not be on an antihypertensive anyway. Afrin can be sprayed into the pack to help constrict the vasculature to reduce any breakthrough bleeding as well as use of ice packs over the nose.   ____________________________ Sammuel Hines. Richardson Landry, MD psb:np D: 07/20/2013 14:40:55 ET T: 07/20/2013 15:06:58 ET JOB#: [id number]  cc: Sammuel Hines. Richardson Landry, MD, <Dictator> Riley Nearing MD ELECTRONICALLY SIGNED 07/20/2013 17:30

## 2014-11-24 NOTE — Consult Note (Signed)
Discussed plans for tentative EGD tomorrow if anesthesia is OK with the left nasal pack.  Will make NPO after midnight.  Chest clear.  Hgb 8.1, HCO3 up to 20, BP 153/101.  Electronic Signatures: Manya Silvas (MD)  (Signed on 17-Dec-14 18:09)  Authored  Last Updated: 17-Dec-14 18:09 by Manya Silvas (MD)

## 2014-11-24 NOTE — Consult Note (Signed)
Patient's hemoglobin is improved and essentially stable.  Will readdress once GI and ENT issues have resolved.  Solu-Medrol for her joint pain will also help her mild ongoing hemolysis. She is also thrombocytopenic which could be consumptive in nature given her active bleeding.  White count is elevated likely secondary to CML and BCR/ABL mutation to confirm is pending.  Recommend monitoring daily CBC.  Will continue to follow at a distance. consult.  Electronic Signatures: Delight Hoh (MD)  (Signed on 19-Dec-14 09:13)  Authored  Last Updated: 19-Dec-14 09:13 by Delight Hoh (MD)

## 2014-11-24 NOTE — Discharge Summary (Signed)
PATIENT NAME:  [name], [name] MR#:  [id number] DATE OF BIRTH:  [birth year]  DATE OF ADMISSION:  07/18/2013 DATE OF DISCHARGE:  07/26/2013  ADMITTING DIAGNOSIS: Shortness of breath.   DISCHARGE DIAGNOSES: 1.  Shortness of breath due to severe iron deficiency anemia.  2.  Severe iron deficiency anemia felt to be multifactorial including acute gastrointestinal blood loss due to her upper gastrointestinal bleed as well as concurrent chronic myelogenous leukemia, as well as severe epistaxis requiring transfusion.  3.  Esophagitis and duodenitis. Keep the patient on proton pump inhibitors b.i.d. No further evidence of bleeding.  4.  Severe epistaxis status post nasal packing with removal.  5.  Severe joint pains status post arthrocentesis. Findings consistent with acute gout flare. The patient started on allopurinol. Has high uric acid. Will follow up outpatient with Dr. Jefm Bryant.  6.  History of alcohol abuse. No evidence of withdrawal.  7.  History of chronic myelogenous leukemia, Oncology outpatient follow-up.  8.  Acute renal failure due to dehydration, now resolved.  9.  Abnormal liver function tests.  10.  History transient ischemic attack.  11.  Breast cancer, status post chemoradiation and lumpectomy.  12.  Status post port placement.   CONSULTANTS DURING HOSPITALIZATION: Dr. Grayland Ormond, Dr. Vira Agar, Dr. Clyde Canterbury.  PERTINENT LABS AND EVALUATIONS: Admitting EKG showed sinus tachycardia, possible left atrial enlargement. Chest x-ray showed left lower lobe scarring without superimposed cardiopulmonary processes. WBC count was 78.7 on presentation, hemoglobin 2.9 platelet count was 214. INR was 1.6. LDH 419, retic count was 15.34, iron binding 392, iron serum was 13, iron saturation 3, ferritin 16. Lipase 160, CPK was 40. CK-MB 1.3. LFTs: Bilirubin total of 1.6, bilirubin direct 0.7, alkaline phosphatase 371, ALT is 12, AST is 38, total protein is 5.9, albumin 2.4. BMP: Glucose 87, BUN 36,  creatinine 1.43, sodium 138, potassium 4.7, chloride 111, CO2 is 11. Troponin less than 0.02, B12 level is 1582, direct Coombs test was negative, haptoglobin level was less than 10, CML profile abnormal, 91% on nuclear positive for BCR/ABL gene infusion. Most recent hemoglobin is 10.2 on December 22. EGD showed gastritis, esophagitis, nonbleeding cratered gastric ulcer with no stigmata of bleeding was found. Chest x-ray showed left lower lobe probable scarring.   HOSPITAL COURSE: Please refer to H and P done by the admitting physician. The patient is a 59 year old white female who presented with shortness of breath. She was found to be profoundly anemic. The patient was admitted for further evaluation and treatment. She was noted to have severe iron deficiency anemia. Also found to be guaiac-positive. She also has a history of CML. The patient received 5 units of packed RBCs throughout the hospitalization. She underwent a luminal evaluation with EGD which showed above findings. The patient will be seen by outpatient GI for possible colonoscopy in the near future. She was also seen by our hematologist who followed her during the hospitalization. The patient also developed severe epistaxis during the hospitalization which was felt to be due to elevated blood pressure. Thrombocytopenia. She was seen by ENT and had nasal packing done, which has subsequently been removed. The patient also started to complain of severe joint pains and was noted to have joint swelling. She underwent arthrocentesis and the findings were consistent with gouty crystals. The patient was treated with IV steroids. She was seen by Dr. Jefm Bryant.  Her symptoms are improved. She will need to follow up outpatient with Dr. Jefm Bryant. At this time, she is doing much better and is  stable for discharge to rehab due to her severe generalized weakness.   DISCHARGE MEDICATIONS: Acetaminophen 325, two tabs q. 4 p.r.n., oxycodone 5 mg q. 8 p.r.n. for pain,  allopurinol 300 daily, metoprolol tartrate 25 mg 1 tab p.o. b.i.d., bacitracin topically affected area 3 times a day to nostril, sucralfate 1 gram 10 mL t.i.d., Oxymetazoline nasal 2 sprays q. 4 hours as needed for bleeding, Protonix 40 mg 1 tab p.o. b.i.d., prednisone taper 60 mg, taper by 10 until complete, colchicine 0.6 daily.   DIET: Mechanical soft.   ACTIVITY: As tolerated with PT .   FOLLOW-UP: With primary M.D. in 1 to 2 weeks. Follow with Dr. Grayland Ormond of oncology in 2 to 4 weeks. Dr. Jefm Bryant in 2 to 4 weeks. Dr. Vira Agar in 6 weeks.    TIME SPENT:  35 minutes spent on discharge to a skilled nursing facility.    ____________________________ Lafonda Mosses. Posey Pronto, MD shp:dp D: 07/26/2013 12:06:25 ET T: 07/26/2013 12:17:38 ET JOB#: [id number]  cc: Andreas Sobolewski H. Posey Pronto, MD, <Dictator> Alric Seton MD ELECTRONICALLY SIGNED 07/29/2013 16:03

## 2014-11-24 NOTE — Consult Note (Signed)
Pt with one impressive ulcer and a few smaller ones in stomach and none bleeding. Will add Carafate slurry and resume mechanical soft diet.  Likely home in 1-2 days to follow up with me and do colonoscopy in 4-6 weeks.  Ulcers could easily explain her anemia but cannot assume LGI tract neg, hold procedure to allow some healing before do a colon prep.  Electronic Signatures: Manya Silvas (MD)  (Signed on 21-Dec-14 11:23)  Authored  Last Updated: 21-Dec-14 11:23 by Manya Silvas (MD)

## 2014-11-24 NOTE — Consult Note (Signed)
Pt CC UGI bleed with severe anemia.  Hgb up to 7.5, color looks much better.  No pain no vomiting.  Due to schedule problems I will do EGD Thursday morning.  Could start clear liq diet tomorrow.  If there is a change in her condition for the worse I could do EGD late  tomorrow afternoon but prefer to do it Thursday.  Discussed with patient and family.  Electronic Signatures: Manya Silvas (MD)  (Signed on 16-Dec-14 18:25)  Authored  Last Updated: 16-Dec-14 18:25 by Manya Silvas (MD)

## 2014-11-24 NOTE — Consult Note (Signed)
History of Present Illness:  Reason for Consult CML, severe symptomatic anemia.   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  She states she took Petros temporarily, but has not been treated in nearly 6 months.  She also noted increasing weakness and fatigue over the past several weeks.  Upon arrival to the emergency room her hemoglobin was found to be less than 3.  Patient did not report any melena, hematochezia, or coffee ground emesis but was found to have heme positive stools.  She also complains of a peripheral neuropathy in her hands and feet is new over the same time frame.  She has no other neurologic complaints.  She denies any fevers.  She has no chest pain or shortness of breath.  She denies any nausea, vomiting, constipation, or diarrhea.  She does admit to one episode of "rust coloed" urine recently.  She offers no further specific complaints.  PFSH:  Additional Past Medical and Surgical History CML, lumpectomy.  Family History:  Negative and noncontributory.  Social History:  Patient denies tobacco and alcohol.   Review of Systems:  Performance Status (ECOG) 2   Review of Systems   As per HPI. Otherwise, 10 point system review was negative.   NURSING NOTES: **Vital Signs.:   15-Dec-14 16:26   Vital Signs Type: Blood Transfusion Complete   Temperature Temperature (F): 98.5   Celsius: 36.9   Temperature Source: oral   Pulse Pulse: 94   Pulse source if not from Vital Sign Device: per cardiac monitor   Respirations Respirations: 21   Systolic BP Systolic BP: 762   Diastolic BP (mmHg) Diastolic BP (mmHg): 65   Mean BP: 89   BP Source  if not from Vital Sign Device: non-invasive   Pulse Ox % Pulse Ox %: 100   Pulse Ox Activity Level: At rest   Oxygen Delivery: 2L; Nasal Cannula   Pulse Ox Heart Rate: 92   Physical Exam:  Physical Exam General: Well-developed, well-nourished, no acute distress. Eyes: Pink  conjunctiva, anicteric sclera. HEENT: Normocephalic, moist mucous membranes, clear oropharnyx. Lungs: Clear to auscultation bilaterally. Heart: Regular rate and rhythm. No rubs, murmurs, or gallops. Abdomen: Soft, nontender, nondistended. No organomegaly noted, normoactive bowel sounds. Musculoskeletal: No edema, cyanosis, or clubbing. Neuro: Alert, answering all questions appropriately. Cranial nerves grossly intact. Skin: No rashes or petechiae noted. Psych: Normal affect.    No Known Allergies:     Goody's Headache Powders: 1 dose(s) orally , As Needed - for Pain, Status: Active, Quantity: 0, Refills: None  Laboratory Results: Hepatic:  15-Dec-14 06:31   Bilirubin, Total  1.6  Bilirubin, Direct  0.7 (Result(s) reported on 18 Jul 2013 at 07:05AM.)  Alkaline Phosphatase  371 (45-117 NOTE: New Reference Range 06/24/13)  SGPT (ALT) 12  SGOT (AST)  38  Total Protein, Serum  5.9  Albumin, Serum  2.4  Routine BB:  15-Dec-14 06:31   ABO Group + Rh Type A Negative  Antibody Screen NEGATIVE (Result(s) reported on 18 Jul 2013 at 07:18AM.)  Crossmatch Unit 1 Ready  Crossmatch Unit 2 Ready  Crossmatch Unit 3 Ready  Crossmatch Unit 4 Ready (Result(s) reported on 18 Jul 2013 at 07:38AM.)  Routine Chem:  15-Dec-14 06:31   Ferritin ([place]) 16 (Result(s) reported on 18 Jul 2013 at 08:43AM.)  Iron Binding Capacity (TIBC) 392  Unbound Iron Binding Capacity 379  Iron, Serum  13  Iron Saturation 3 (Result(s) reported on 18 Jul 2013 at 08:10AM.)  LDH, Serum  419 (Result(s) reported on 18 Jul 2013 at 08:43AM.)  Lipase 160 (Result(s) reported on 18 Jul 2013 at 06:59AM.)  Glucose, Serum 87  BUN  36  Creatinine (comp)  1.43  Sodium, Serum 138  Potassium, Serum 4.7  Chloride, Serum  111  CO2, Serum  11  Calcium (Total), Serum  8.2  Anion Gap 16  Osmolality (calc) 283  eGFR (African American)  46  eGFR (Non-African American)  40 (eGFR values <58m/min/1.73 m2 may be an indication of  chronic kidney disease (CKD). Calculated eGFR is useful in patients with stable renal function. The eGFR calculation will not be reliable in acutely ill patients when serum creatinine is changing rapidly. It is not useful in  patients on dialysis. The eGFR calculation may not be applicable to patients at the low and high extremes of body sizes, pregnant women, and vegetarians.)    10:55   Result Comment hgb - RESULTS VERIFIED BY REPEAT TESTING.  - NOTIFIED OF CRITICAL VALUE  - mpg c/ kathy cox @ [id number]/15/14  - READ-BACK PROCESS PERFORMED.  Result(s) reported on 18 Jul 2013 at 11:18AM.  Cardiac:  15-Dec-14 06:31   Troponin I < 0.02 (0.00-0.05 0.05 ng/mL or less: NEGATIVE  Repeat testing in 3-6 hrs  if clinically indicated. >0.05 ng/mL: POTENTIAL  MYOCARDIAL INJURY. Repeat  testing in 3-6 hrs if  clinically indicated. NOTE: An increase or decrease  of 30% or more on serial  testing suggests a  clinically important change)  CK, Total 40  CPK-MB, Serum 1.3 (Result(s) reported on 18 Jul 2013 at 07:05AM.)  Routine Coag:  15-Dec-14 06:31   Prothrombin  18.9  INR 1.6 (INR reference interval applies to patients on anticoagulant therapy. A single INR therapeutic range for coumarins is not optimal for all indications; however, the suggested range for most indications is 2.0 - 3.0. Exceptions to the INR Reference Range may include: Prosthetic heart valves, acute myocardial infarction, prevention of myocardial infarction, and combinations of aspirin and anticoagulant. The need for a higher or lower target INR must be assessed individually. Reference: The Pharmacology and Management of the Vitamin K  antagonists: the seventh ACCP Conference on Antithrombotic and Thrombolytic Therapy. COIBBC.4888Sept:126 (3suppl): [id number] A HCT value >55% may artifactually increase the PT.  In one study,  the increase was an average of 25%. Reference:  "Effect on Routine and Special Coagulation  Testing Values of Citrate Anticoagulant Adjustment in Patients with High HCT Values." American Journal of Clinical Pathology 2006;126:400-405.)  Activated PTT (APTT)  36.3 (A HCT value >55% may artifactually increase the APTT. In one study, the increase was an average of 19%. Reference: "Effect on Routine and Special Coagulation Testing Values of Citrate Anticoagulant Adjustment in Patients with High HCT Values." American Journal of Clinical Pathology 2006;126:400-405.)  Routine Hem:  15-Dec-14 06:31   Retic Count  15.34  Absolute Retic Count  0.3048 (Result(s) reported on 18 Jul 2013 at 08:24AM.)    10:55   Hemoglobin (CBC)  3.7   Assessment and Plan: Impression:   CML, severe symptomatic anemia. Plan:   1.  Anemia: Possibly secondary to GI bleed and GI has been consulted.  Patient is iron deficient and may benefit from IV iron in the future.  Her LDH is mildly elevated, therefore will get Coombs' test as well as LDH to ensure there is no underlying hemolysis. Agree with blood transfusion, continue to daily CBC. CML: Patient has not had any treatment  in over 6 months, once she is discharged from the hospital we will attempt to reinitiate Gleevec.  Will document BCR-ABL in the meantime. consult, will follow.  Electronic Signatures: Delight Hoh (MD)  (Signed 15-Dec-14 16:46)  Authored: HISTORY OF PRESENT ILLNESS, PFSH, ROS, NURSING NOTES, PE, ALLERGIES, HOME MEDICATIONS, LABS, ASSESSMENT AND PLAN   Last Updated: 15-Dec-14 16:46 by Delight Hoh (MD)

## 2014-11-24 NOTE — Consult Note (Signed)
Gout was better but more pain with ambulation today.  Eating mechanical soft diet and tol ok.  Will do EGD Sunday if ENT pulls packing tomorrow.  Hgb good at 9.7 which is 300% better than on admission.  VSS afeb, on 2L [place].  Electronic Signatures: Manya Silvas (MD)  (Signed on 19-Dec-14 18:40)  Authored  Last Updated: 19-Dec-14 18:40 by Manya Silvas (MD)

## 2014-11-24 NOTE — Consult Note (Signed)
Pt with packing in left nostril.  Hgb holding very well.  No vomiting, aches all over in bones and joints. Continue iv meds and advance diet to full liquids if not already there and hold elective EGD until packing out.  Electronic Signatures: Manya Silvas (MD)  (Signed on 18-Dec-14 17:07)  Authored  Last Updated: 18-Dec-14 17:07 by Manya Silvas (MD)

## 2014-11-24 NOTE — Op Note (Signed)
PATIENT NAME:  [name], [name] MR#:  [id number] DATE OF BIRTH:  [birth year]  DATE OF PROCEDURE:  07/20/2013  PREOPERATIVE DIAGNOSIS: Left epistaxis.   POSTOPERATIVE DIAGNOSIS:  Left epistaxis.   PROCEDURE:  Anterior/posterior packing of the left nasal cavity for control of epistaxis.   SURGEON: Jill Poling, MD   DESCRIPTION OF PROCEDURE: Patient's nose was actively bleeding. I discussed that we would need to place a pack and would cause some pain. A 10 cm Pope Pack was then pushed in the left nasal cavity all the way back to the posterior nasal cavity, and immediately expanded using Afrin nasal spray. The packing placement was uncomfortable for the patient, obviously and I tried to explain to her as best I could, the necessity given active bleeding and her significant anemia. There was some oozing around the pack at the completion of the procedure. The nose clamp was applied to help provide further pressure and more Afrin was sprayed into the pack. Her blood pressure is also elevated and needed to be brought down and this was discussed with the nurse to relay to her managing physician. I later checked on her roughly three hours later and there was no active bleeding at this point. She was uncomfortable with a pack, but tolerating reasonably well. We will leave it in place for three days.    ____________________________ Denise Hines. Richardson Landry, MD psb:cc D: 07/20/2013 14:42:57 ET T: 07/20/2013 20:13:17 ET JOB#: [id number]  cc: Denise Hines. Richardson Landry, MD, <Dictator> Riley Nearing MD ELECTRONICALLY SIGNED 08/02/2013 8:01

## 2014-11-25 NOTE — Consult Note (Signed)
PATIENT NAME:  [name], [name] MR#:  [id number] DATE OF BIRTH:  06/24/1956  DATE OF CONSULTATION:  07/18/2013  CONSULTING PHYSICIAN:  Manya Silvas, MD  HISTORY OF PRESENT ILLNESS: The patient is a 59 year old female who has been diagnosed with CML at [facility]. She was having symptoms of weakness, trouble breathing, chest tightness, numbness inability to stand up, melena a week ago. She had a transient ischemic attack about four or five days ago and lasted a few hours. After passing out today, somehow she got to the hospital and was found to have a hemoglobin of 2.9 and was admitted to the hospital. I was asked to see her in consultation.   MEDICATIONS: Excedrin or Goody powder 4 or 5 times a day. Previously she had been on antidepressants and antihypertension medicine, but not currently on these.   HABITS: Drinks alcohol a few times a week, gin and tonic.   FAMILY HISTORY: Coronary artery disease and father died in his 72s. Mother died after a PE after a hip replacement. Diabetes and hypertension run in the family.   The patient lived [place] for four years and moved back to [place] within the last year. She had been followed at [facility] for [facility]   She has never had an upper endoscopy or colonoscopy.   PAST MEDICAL HISTORY:  1.  Breast cancer, previous lumpectomy and chemoradiation.  2.  Hypertension.  3.  Anemia.   ALLERGIES: No known drug allergies.   REVIEW OF SYSTEMS:  She has felt numbness for a  week, neuropathy, bone pain, shortness of breath, progressive weakness. Since she has been admitted to the hospital, she is feeling considerably better and her hemoglobin has gone from 2.9 to 6.2   Because of the black stools and use of Goody's and alcohol, there is a strong suspicion of upper gastrointestinal tract bleeding   PHYSICAL EXAMINATION: GENERAL: Black female in no acute distress, talkative, responsive, appropriate.  VITAL SIGNS: Blood pressure 130/60, pulse 88, temperature  98.4, sats 100% on 2 liters.  HEENT: Sclerae anicteric. Conjunctivae negative. Tongue negative.  HEAD: Atraumatic. There is an EJ line in the left side of the neck.  CHEST: Clear in the anterior fields.  HEART: No murmurs or gallops I can hear.  ABDOMEN: Palpable fullness in the epigastric area, possible lobe of the liver. There is some diffuse mild abdominal pain. No palpable splenomegaly often present in CML, however.  SKIN: Warm and dry.  PSYCH: Mood and affect are appropriate. She is oriented to person, place, and time.   LABORATORY, DIAGNOSTIC AND RADIOLOGICAL DATA: On admission this morning at 6:30 in the morning, GLUCOSE 87, IRON 13, BUN 36, CREATININE 1.43, SODIUM 138, POTASSIUM 4.7, CHLORIDE 111, CO2 11, anion gap 16, iron saturation 3%, calcium 8.2, LDH 419. Lipase 160, ferritin is 16. Folic acid 48.5, total protein 5.9, albumin 2.4, total bilirubin 1.6, direct bilirubin 0.7, alkaline phosphatase 371, SGOT 38, SGPT 12. Troponins negative x 3. White blood count 78.7 thousand, hemoglobin on admission 2.9, at 1700 it was 6.2 after transfusions, platelet count on admission 214, retic count 15, negative antibody screen, negative direct Coombs test. Pro-time 18.9 with INR 1.6.   Portable chest x-ray shows a left loop lower lobe, probable scarring without superimposed acute cardiopulmonary disease.   ASSESSMENT: Given her a profound anemia. This must have been occurring on a gradual basis,  likely chronic GI bleeding from gastritis or ulcer disease due to the use of Goody's,  Excedrin and alcohol and causing  black stools,  which would cause a gradual anemia.   I would like to see her bicarbonate had come up with her transfusions. I expect this to happen. Probably we can do so, wait a day or two on endoscopy while she gets transfused enough to come up to an appropriate level. The possibility of leukemic ulcerations in the GI tract is  certainly present. She has never had a colonoscopy. That may be  appropriate to do some day and she had a TIA, we may want to hold off on that for a little while. It is possible the TIA could been related to insufficiency because of her profound anemia. I will follow with you.   ____________________________ Manya Silvas, MD rte:cc D: 07/18/2013 18:50:08 ET T: 07/18/2013 19:08:28 ET JOB#: [id number]  cc: Manya Silvas, MD, <Dictator> Kathlene November. Grayland Ormond, MD [place] Leslye Peer, MD Manya Silvas MD ELECTRONICALLY SIGNED 08/18/2013 17:35

## 2015-02-09 IMAGING — CR DG CHEST 1V PORT
1 series · 1 of 1 positions shown · non-contrast
Comparison: Chest radiograph report September 08, 2006 though images
are not available for direct comparison.

CLINICAL DATA: Cough and chest pain with fever.

EXAM:
PORTABLE CHEST - 1 VIEW

[ap]
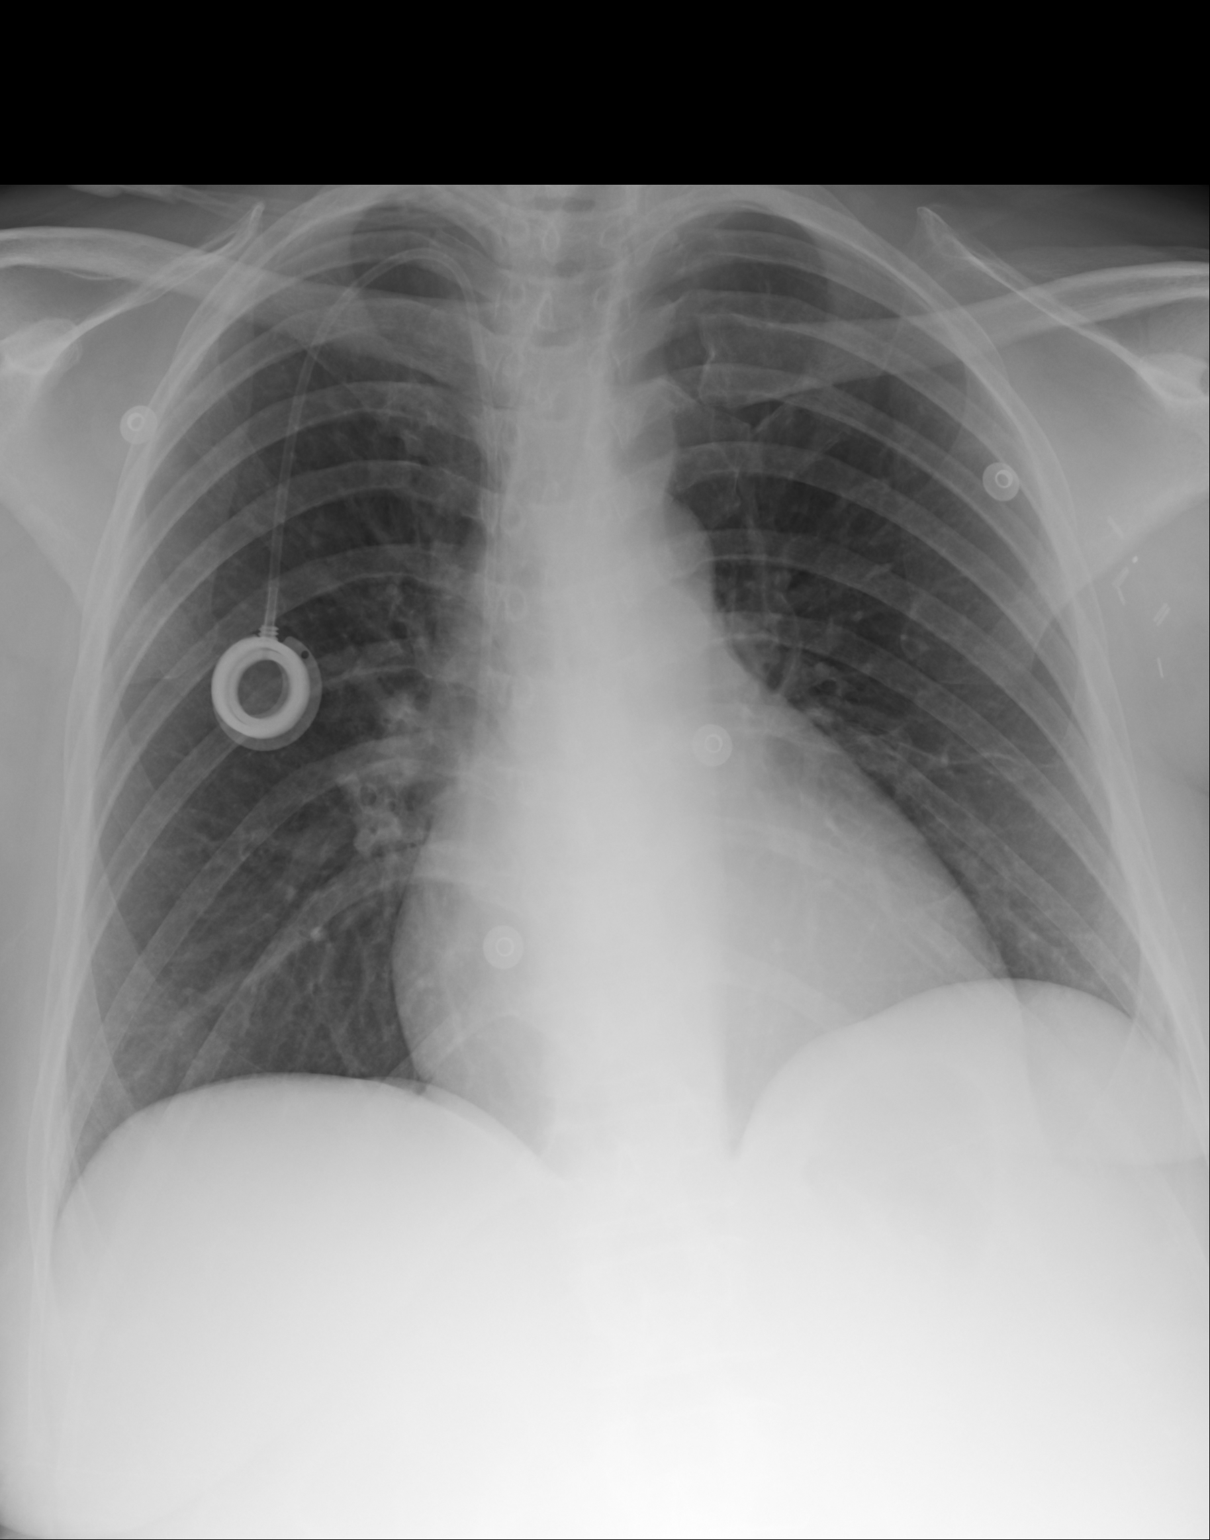

[1 of 1 positions shown; findings below may reference images not displayed]

FINDINGS: Cardiomediastinal silhouette is unremarkable. Minimal linear density
in left lower lobe. The lungs are otherwise clear without pleural
effusions or focal consolidations. Pulmonary vasculature is
unremarkable. Trachea projects midline and there is no pneumothorax.
Soft tissue planes and included osseous structures are
nonsuspicious. Single-lumen right chest Port-A-Cath in place with
catheter tip projecting at cavoatrial junction. Surgical clips in
left axilla.
IMPRESSION: Left lower lobe probable scarring without superimposed acute
cardiopulmonary process.

  By: Chella Saldivar

## 2015-04-16 IMAGING — CR DG LUMBAR SPINE COMPLETE W/ BEND
1 series · 7 of 7 positions shown · non-contrast
Comparison: None.

CLINICAL DATA: Chronic back pain.

EXAM:
LUMBAR SPINE - COMPLETE WITH BENDING VIEWS

[Series 1: t lumbar spine obl · 0.14mm/px · 7 of 7 slices shown]
[im 1/7]
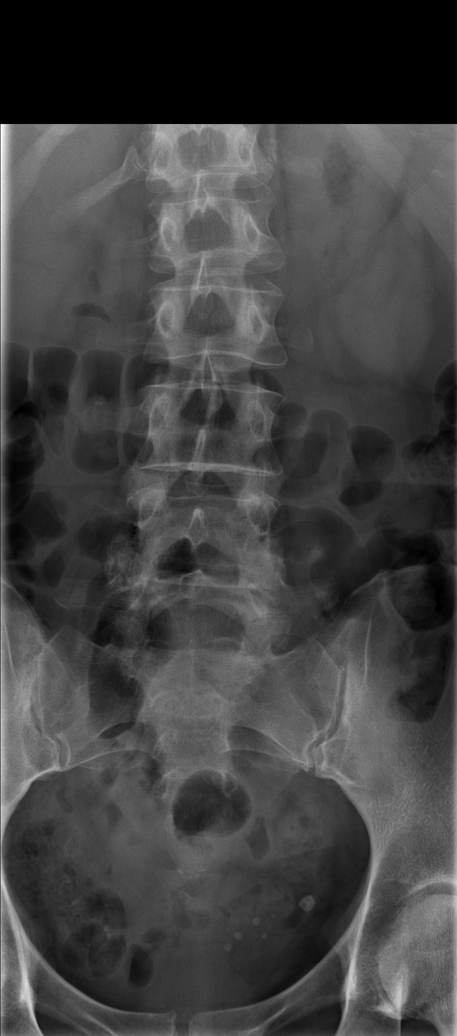
[im 2/7]
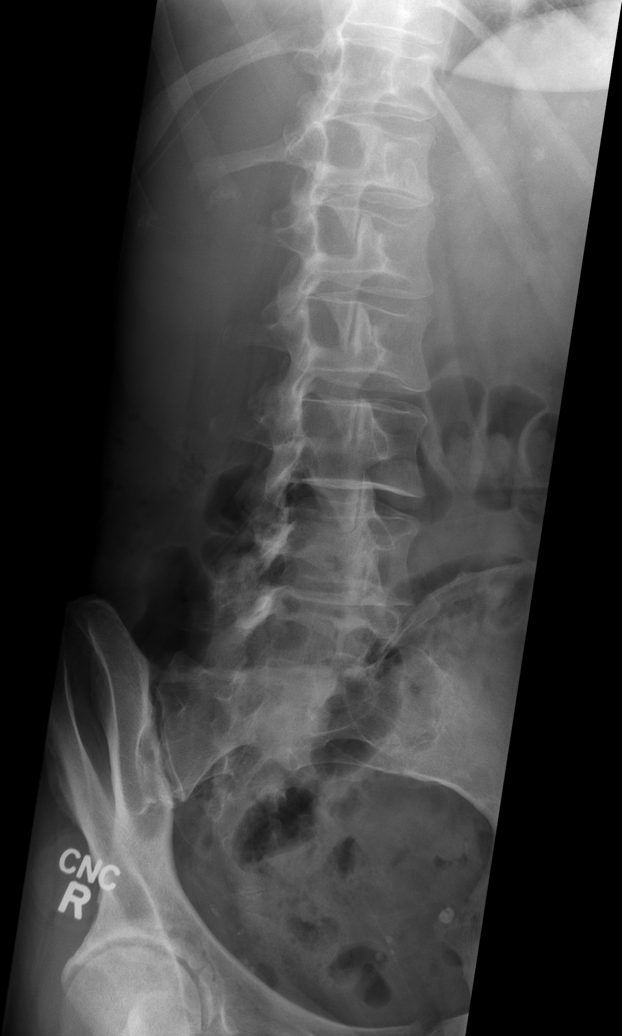
[im 3/7]
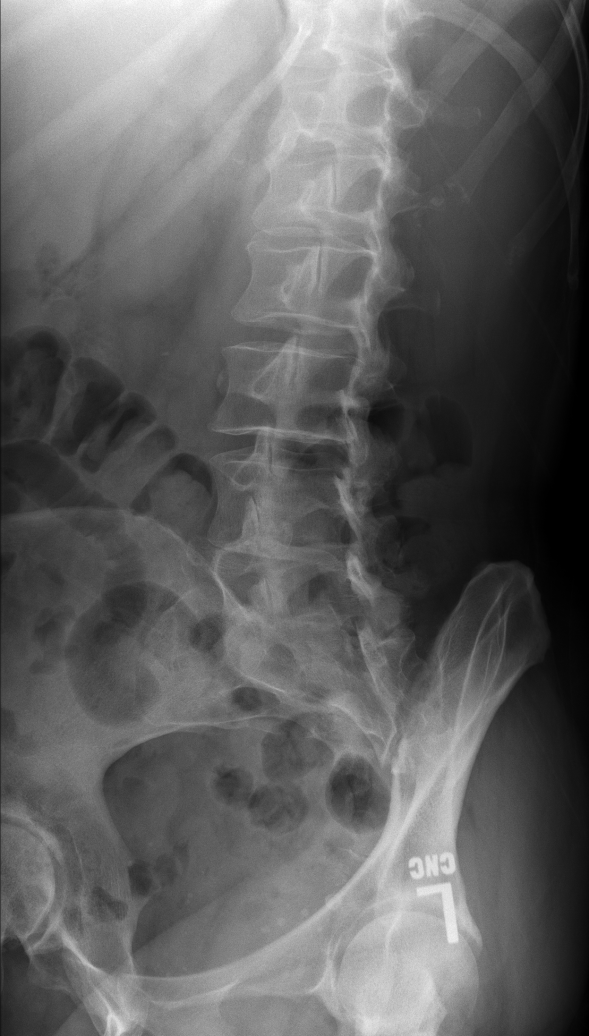
[im 4/7]
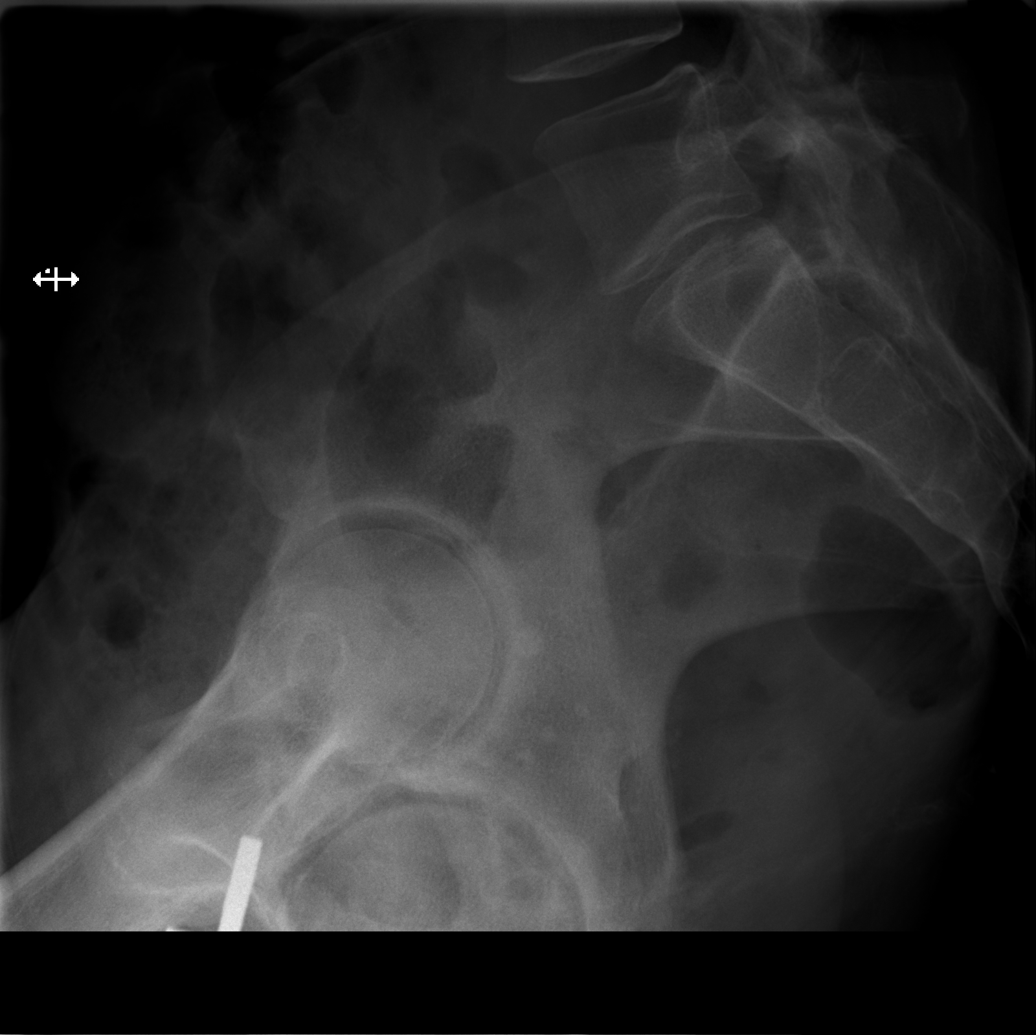
[im 5/7]
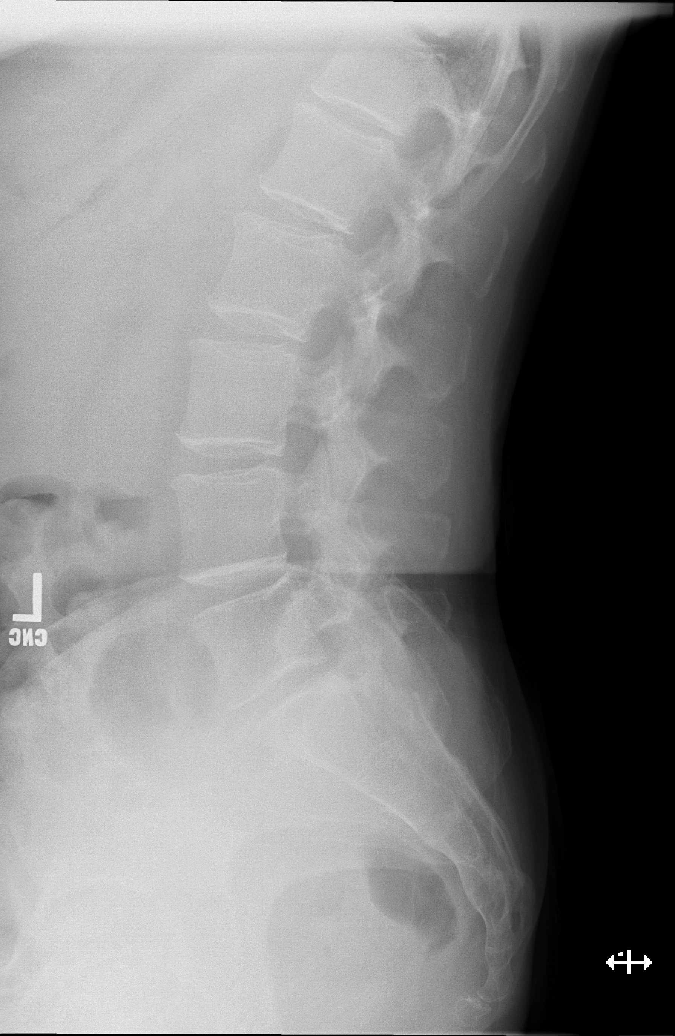
[im 6/7]
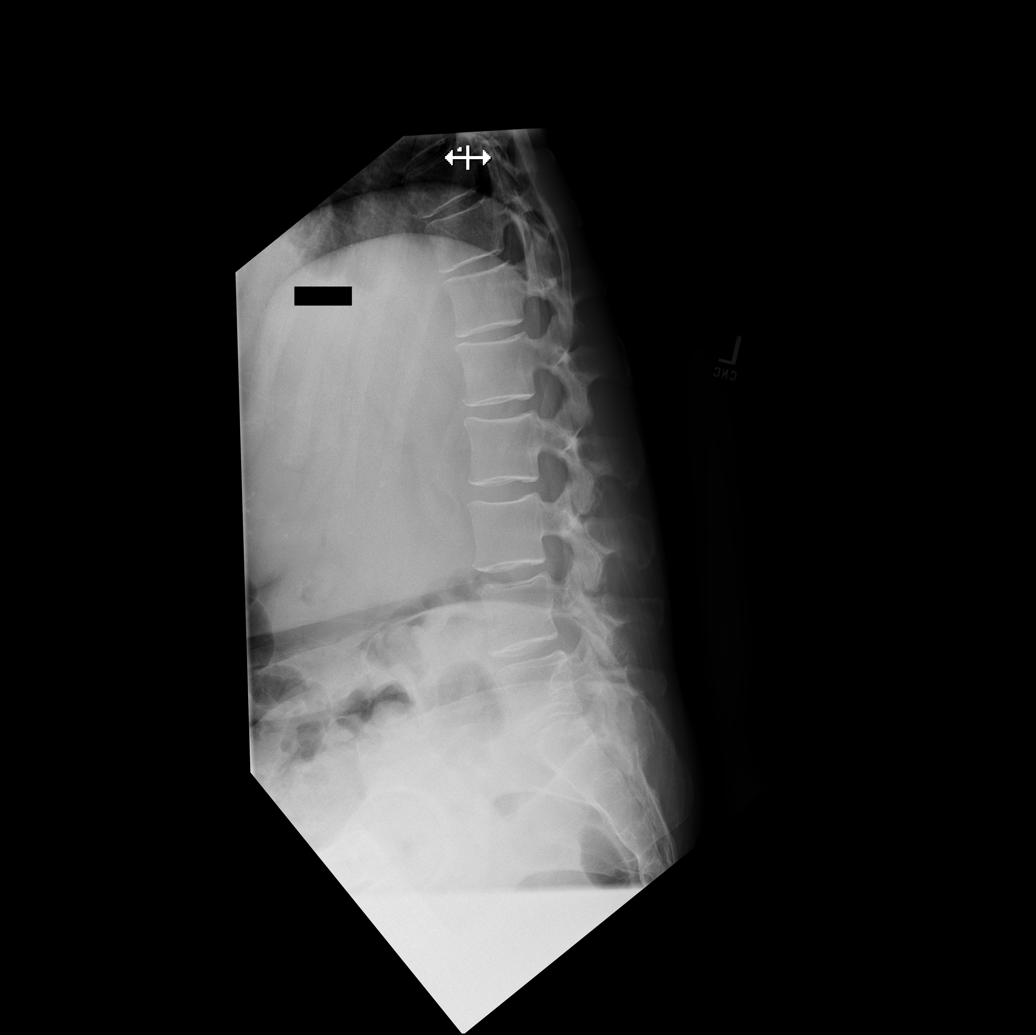
[im 7/7]
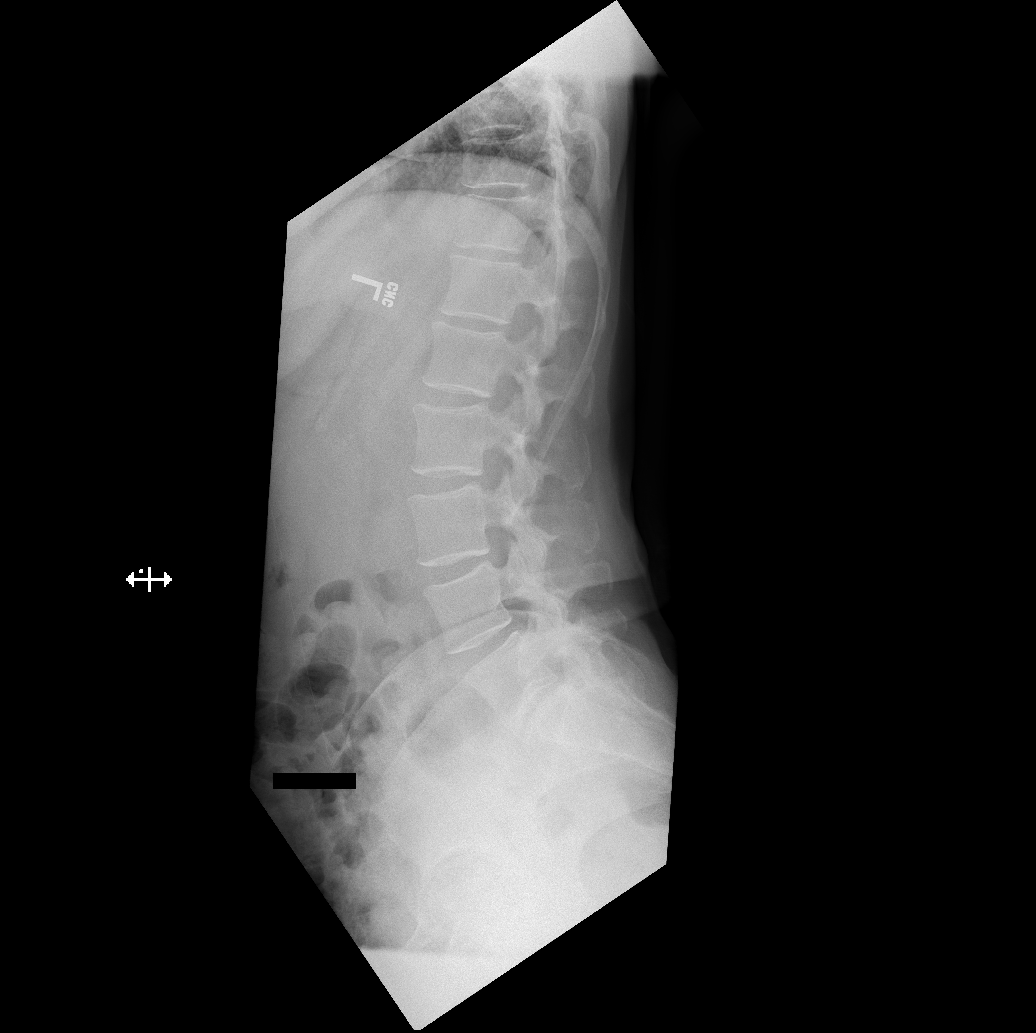

[7 of 7 positions shown; findings below may reference images not displayed]

FINDINGS: Mild left convex upper lumbar scoliosis. Normal alignment on the
lateral film. Disc spaces and vertebral bodies are maintained.
Lateral flexion extension films demonstrate limited range of motion
but no instability or subluxation. The oblique films demonstrate
facet degenerative changes at L4-5 and L5-S1 but no pars defects.
The visualized bony pelvis is intact.
IMPRESSION: Mild scoliosis and mild degenerative changes but no acute bony
findings, destructive bony changes or instability/subluxation with
flexion/ extension.

## 2015-04-16 IMAGING — CR CERVICAL SPINE - COMPLETE 4+ VIEW
1 series · 6 of 6 positions shown · non-contrast
Comparison: None.

CLINICAL DATA: Chronic neck pain.

EXAM:
CERVICAL SPINE  4+ VIEWS

[Series 1: w cervical spine obl · 0.14mm/px · 6 of 6 slices shown]
[im 1/6]
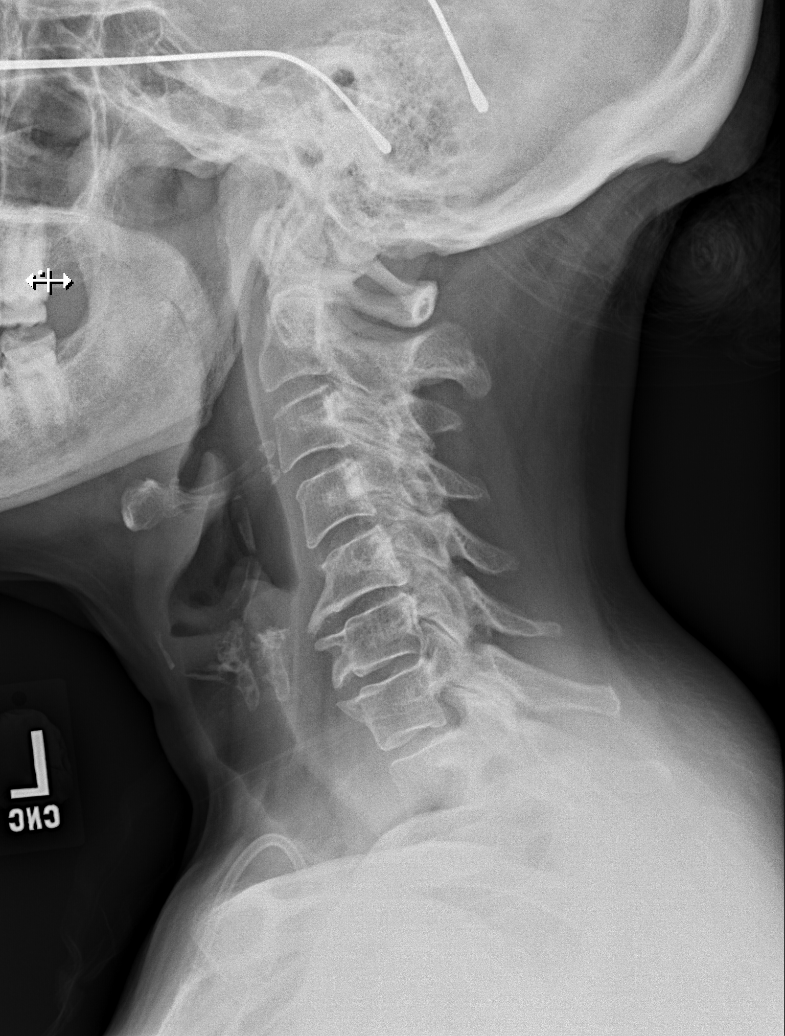
[im 2/6]
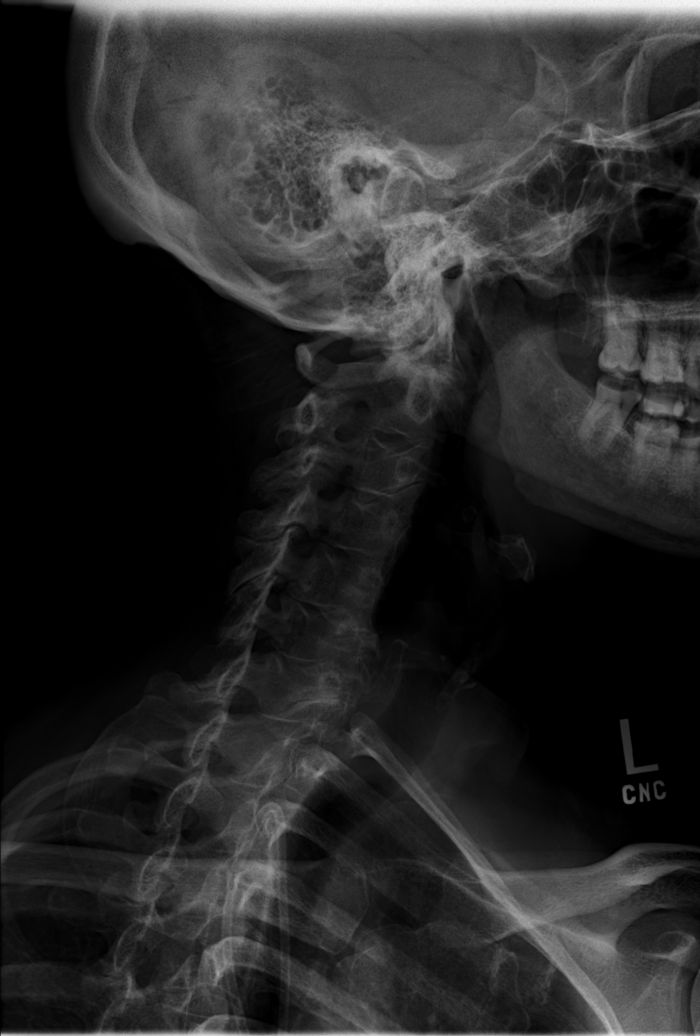
[im 3/6]
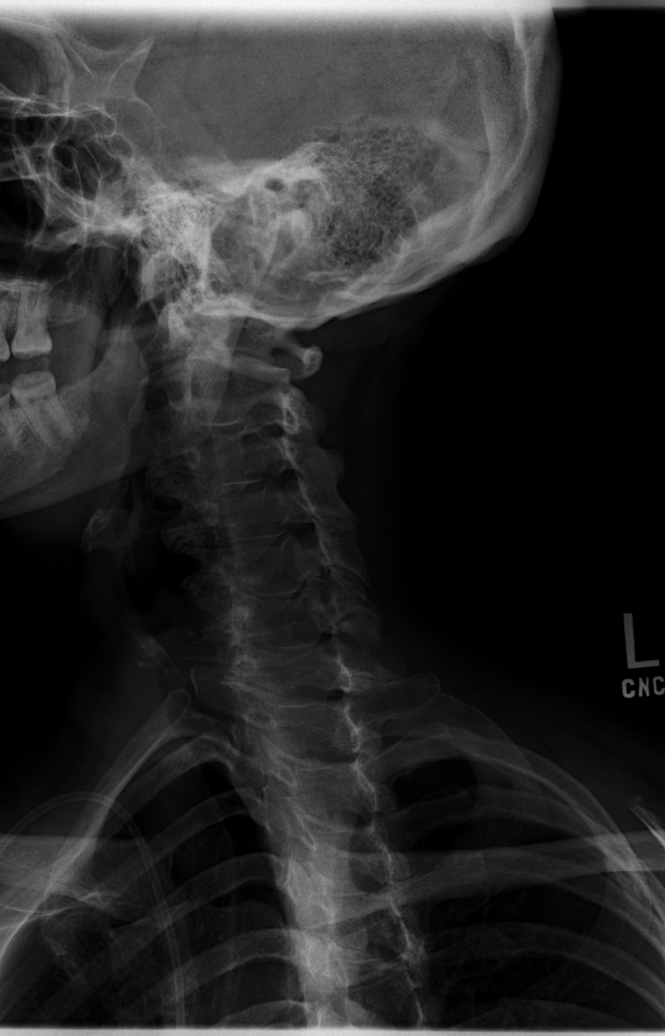
[im 4/6]
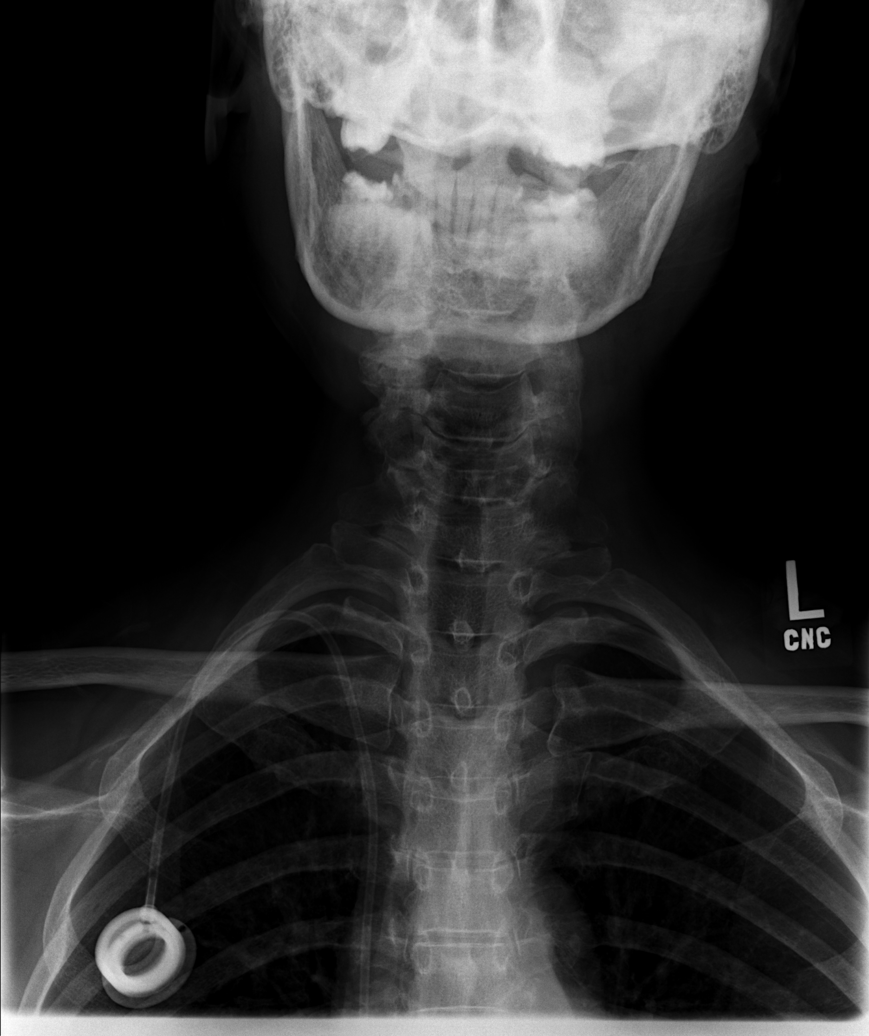
[im 5/6]
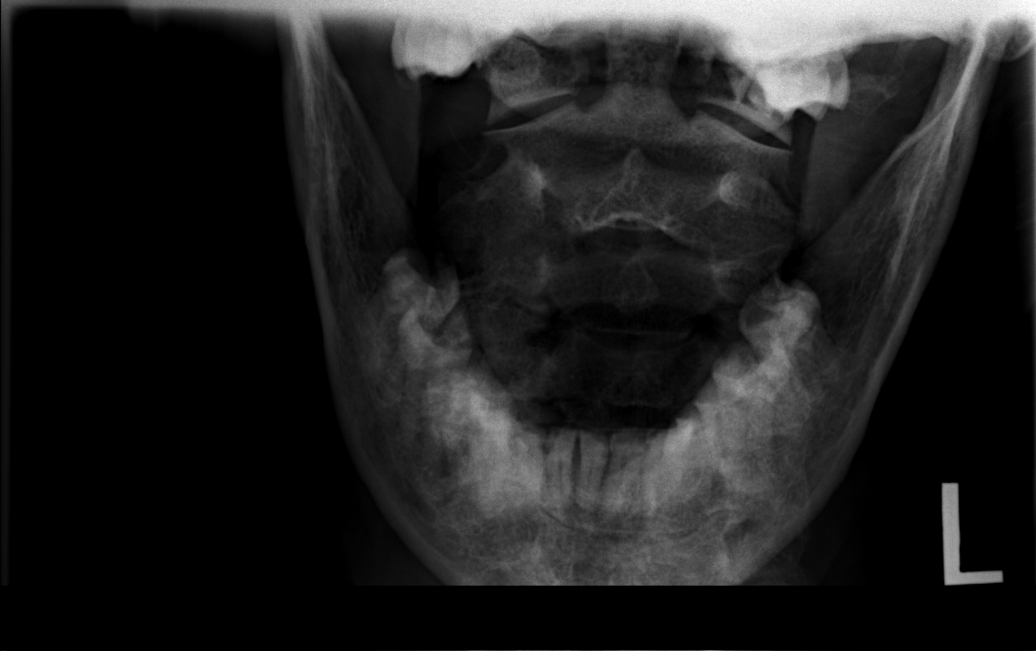
[im 6/6]
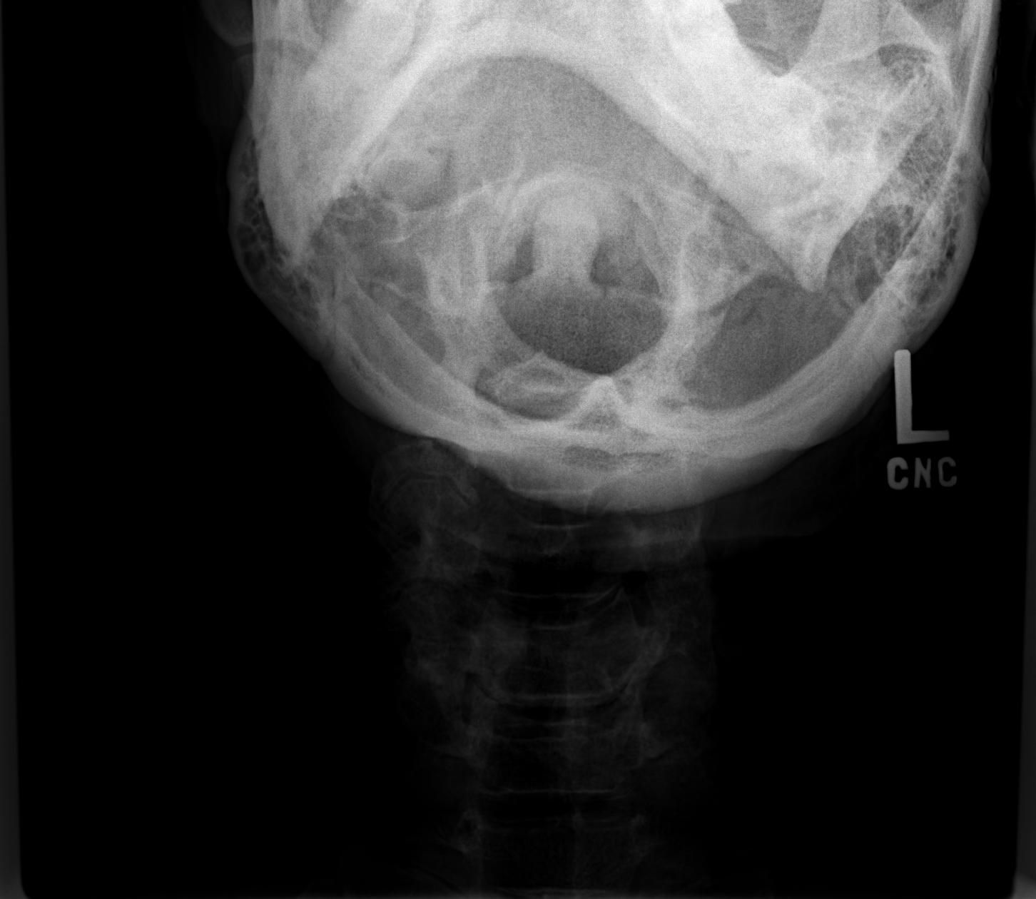

[6 of 6 positions shown; findings below may reference images not displayed]

FINDINGS: Moderate degenerative cervical spondylosis with disc disease most
notable at C5-6 and C6-7 with large marginal osteophytes. There is
also advanced facet disease at C3-4, C4-5 and C5-6 in particular.
Combination of uncinate spurring changes and facet disease
contribute to bilateral bony foraminal stenosis most notable at
C3-4, C4-5 and C5-6 bilaterally. No acute bony findings or abnormal
prevertebral soft tissue swelling the C1-2 articulations are
maintained. The dens is normal. The lung apices are clear. A
right-sided Port-A-Cath is noted.
IMPRESSION: Moderate degenerative cervical spondylosis with disc disease and
facet disease as discussed above. There is multilevel bony foraminal
stenosis.

No acute bony findings or destructive bony changes.

## 2017-02-10 ENCOUNTER — Ambulatory Visit
Admission: RE | Admit: 2017-02-10 | Discharge: 2017-02-10 | Disposition: A | Discharge status: Home / Self Care | Payer: MEDICARE

## 2017-02-10 ENCOUNTER — Ambulatory Visit
Admission: RE | Admit: 2017-02-10 | Discharge: 2017-02-10 | Disposition: A | Discharge status: Home / Self Care | Payer: MEDICARE | Attending: Adult Health | Admitting: Adult Health

## 2017-02-10 DIAGNOSIS — C9210 Chronic myeloid leukemia, BCR/ABL-positive, not having achieved remission: Principal | ICD-10-CM

## 2017-02-10 DIAGNOSIS — E538 Deficiency of other specified B group vitamins: Secondary | ICD-10-CM

## 2017-03-04 ENCOUNTER — Ambulatory Visit: Admission: RE | Admit: 2017-03-04 | Discharge: 2017-03-04

## 2017-03-04 DIAGNOSIS — Z79899 Other long term (current) drug therapy: Principal | ICD-10-CM

## 2017-03-05 ENCOUNTER — Ambulatory Visit: Admission: RE | Admit: 2017-03-05 | Discharge: 2017-03-05 | Attending: Internal Medicine

## 2017-03-05 DIAGNOSIS — Z79899 Other long term (current) drug therapy: Principal | ICD-10-CM

## 2017-04-08 MED ORDER — FOLIC ACID 1 MG TABLET
ORAL_TABLET | 3 refills | 0 days | Status: CP
Start: 2017-04-08 — End: 2018-02-16

## 2017-04-09 ENCOUNTER — Ambulatory Visit
Admission: RE | Admit: 2017-04-09 | Discharge: 2017-04-09 | Disposition: A | Discharge status: Home / Self Care | Payer: MEDICARE

## 2017-04-09 DIAGNOSIS — C9210 Chronic myeloid leukemia, BCR/ABL-positive, not having achieved remission: Principal | ICD-10-CM

## 2017-05-04 ENCOUNTER — Ambulatory Visit
Admission: RE | Admit: 2017-05-04 | Discharge: 2017-05-04 | Disposition: A | Discharge status: Home / Self Care | Payer: MEDICARE | Attending: Adult Health | Admitting: Adult Health

## 2017-05-04 ENCOUNTER — Ambulatory Visit
Admission: RE | Admit: 2017-05-04 | Discharge: 2017-05-04 | Disposition: A | Discharge status: Home / Self Care | Payer: MEDICARE

## 2017-05-04 DIAGNOSIS — C9210 Chronic myeloid leukemia, BCR/ABL-positive, not having achieved remission: Principal | ICD-10-CM

## 2017-05-04 DIAGNOSIS — D599 Acquired hemolytic anemia, unspecified: Secondary | ICD-10-CM

## 2017-06-05 ENCOUNTER — Ambulatory Visit
Admission: RE | Admit: 2017-06-05 | Discharge: 2017-06-05 | Disposition: A | Discharge status: Home / Self Care | Payer: MEDICARE

## 2017-06-05 DIAGNOSIS — C9210 Chronic myeloid leukemia, BCR/ABL-positive, not having achieved remission: Principal | ICD-10-CM

## 2017-06-23 MED ORDER — GABAPENTIN 100 MG CAPSULE
ORAL_CAPSULE | Freq: Two times a day (BID) | ORAL | 11 refills | 0.00000 days | Status: CP
Start: 2017-06-23 — End: 2018-04-07

## 2017-07-09 ENCOUNTER — Ambulatory Visit
Admission: RE | Admit: 2017-07-09 | Discharge: 2017-07-09 | Disposition: A | Discharge status: Home / Self Care | Payer: MEDICARE

## 2017-07-09 ENCOUNTER — Ambulatory Visit
Admission: RE | Admit: 2017-07-09 | Discharge: 2017-07-09 | Disposition: A | Discharge status: Home / Self Care | Payer: MEDICARE | Attending: Student in an Organized Health Care Education/Training Program | Admitting: Student in an Organized Health Care Education/Training Program

## 2017-07-09 DIAGNOSIS — C9210 Chronic myeloid leukemia, BCR/ABL-positive, not having achieved remission: Principal | ICD-10-CM

## 2017-07-09 DIAGNOSIS — D590 Drug-induced autoimmune hemolytic anemia: Secondary | ICD-10-CM

## 2017-07-09 DIAGNOSIS — D599 Acquired hemolytic anemia, unspecified: Secondary | ICD-10-CM

## 2017-07-29 MED ORDER — LIDOCAINE-PRILOCAINE 2.5 %-2.5 % TOPICAL CREAM
0 refills | 0 days | Status: CP
Start: 2017-07-29 — End: 2018-07-29

## 2017-07-31 ENCOUNTER — Ambulatory Visit
Admission: RE | Admit: 2017-07-31 | Discharge: 2017-07-31 | Disposition: A | Discharge status: Home / Self Care | Payer: MEDICARE

## 2017-07-31 ENCOUNTER — Ambulatory Visit
Admission: RE | Admit: 2017-07-31 | Discharge: 2017-07-31 | Disposition: A | Discharge status: Home / Self Care | Payer: MEDICARE | Attending: Adult Health | Admitting: Adult Health

## 2017-07-31 DIAGNOSIS — C9210 Chronic myeloid leukemia, BCR/ABL-positive, not having achieved remission: Principal | ICD-10-CM

## 2017-07-31 DIAGNOSIS — D599 Acquired hemolytic anemia, unspecified: Secondary | ICD-10-CM

## 2017-09-02 MED ORDER — IMATINIB 100 MG TABLET
ORAL_TABLET | Freq: Every day | ORAL | 3 refills | 0 days | Status: CP
Start: 2017-09-02 — End: 2017-12-05

## 2017-11-03 MED ORDER — ALLOPURINOL 100 MG TABLET
ORAL_TABLET | 4 refills | 0 days | Status: CP
Start: 2017-11-03 — End: 2018-05-26

## 2017-11-17 ENCOUNTER — Ambulatory Visit: Admit: 2017-11-17 | Discharge: 2017-11-17 | Payer: MEDICARE

## 2017-11-17 ENCOUNTER — Ambulatory Visit: Admit: 2017-11-17 | Discharge: 2017-11-17 | Payer: MEDICARE | Attending: Adult Health | Primary: Adult Health

## 2017-11-17 DIAGNOSIS — Z452 Encounter for adjustment and management of vascular access device: Secondary | ICD-10-CM

## 2017-11-17 DIAGNOSIS — C9210 Chronic myeloid leukemia, BCR/ABL-positive, not having achieved remission: Principal | ICD-10-CM

## 2017-12-04 ENCOUNTER — Ambulatory Visit: Admit: 2017-12-04 | Discharge: 2017-12-04 | Payer: MEDICARE

## 2017-12-05 MED ORDER — IMATINIB 100 MG TABLET
ORAL_TABLET | 2 refills | 0 days | Status: CP
Start: 2017-12-05 — End: 2018-02-17

## 2017-12-08 ENCOUNTER — Ambulatory Visit: Admit: 2017-12-08 | Discharge: 2017-12-08 | Payer: MEDICARE

## 2017-12-29 ENCOUNTER — Ambulatory Visit: Admit: 2017-12-29 | Discharge: 2017-12-30 | Payer: MEDICARE

## 2017-12-29 DIAGNOSIS — C9210 Chronic myeloid leukemia, BCR/ABL-positive, not having achieved remission: Secondary | ICD-10-CM

## 2018-01-19 ENCOUNTER — Ambulatory Visit: Admit: 2018-01-19 | Discharge: 2018-01-19 | Payer: MEDICARE | Attending: Adult Health | Primary: Adult Health

## 2018-01-19 ENCOUNTER — Ambulatory Visit: Admit: 2018-01-19 | Discharge: 2018-01-19 | Payer: MEDICARE

## 2018-01-19 DIAGNOSIS — D649 Anemia, unspecified: Secondary | ICD-10-CM

## 2018-01-19 DIAGNOSIS — C9210 Chronic myeloid leukemia, BCR/ABL-positive, not having achieved remission: Principal | ICD-10-CM

## 2018-01-21 ENCOUNTER — Ambulatory Visit: Admit: 2018-01-21 | Discharge: 2018-01-22 | Payer: MEDICARE

## 2018-01-21 DIAGNOSIS — C9210 Chronic myeloid leukemia, BCR/ABL-positive, not having achieved remission: Secondary | ICD-10-CM

## 2018-01-21 DIAGNOSIS — D599 Acquired hemolytic anemia, unspecified: Principal | ICD-10-CM

## 2018-01-27 ENCOUNTER — Ambulatory Visit
Admit: 2018-01-27 | Discharge: 2018-01-27 | Payer: MEDICARE | Attending: Student in an Organized Health Care Education/Training Program | Primary: Student in an Organized Health Care Education/Training Program

## 2018-01-27 ENCOUNTER — Ambulatory Visit: Admit: 2018-01-27 | Discharge: 2018-01-27 | Payer: MEDICARE

## 2018-01-27 DIAGNOSIS — C9210 Chronic myeloid leukemia, BCR/ABL-positive, not having achieved remission: Principal | ICD-10-CM

## 2018-01-27 DIAGNOSIS — D599 Acquired hemolytic anemia, unspecified: Secondary | ICD-10-CM

## 2018-01-27 DIAGNOSIS — D649 Anemia, unspecified: Secondary | ICD-10-CM

## 2018-02-17 MED ORDER — IMATINIB 100 MG TABLET
ORAL_TABLET | 1 refills | 0 days | Status: CP
Start: 2018-02-17 — End: 2018-04-07

## 2018-02-17 MED ORDER — FOLIC ACID 1 MG TABLET
ORAL_TABLET | 3 refills | 0 days | Status: CP
Start: 2018-02-17 — End: ?

## 2018-03-02 ENCOUNTER — Ambulatory Visit: Admit: 2018-03-02 | Discharge: 2018-03-02 | Payer: MEDICARE

## 2018-03-02 ENCOUNTER — Ambulatory Visit: Admit: 2018-03-02 | Discharge: 2018-03-02 | Payer: MEDICARE | Attending: Adult Health | Primary: Adult Health

## 2018-03-02 DIAGNOSIS — C9210 Chronic myeloid leukemia, BCR/ABL-positive, not having achieved remission: Principal | ICD-10-CM

## 2018-03-02 DIAGNOSIS — R197 Diarrhea, unspecified: Secondary | ICD-10-CM

## 2018-03-02 DIAGNOSIS — E876 Hypokalemia: Secondary | ICD-10-CM

## 2018-03-02 DIAGNOSIS — E860 Dehydration: Secondary | ICD-10-CM

## 2018-03-31 ENCOUNTER — Encounter
Admit: 2018-03-31 | Discharge: 2018-04-07 | Disposition: A | Discharge status: Home / Self Care | Payer: MEDICARE | Attending: Certified Registered" | Admitting: Hematology & Oncology

## 2018-03-31 ENCOUNTER — Ambulatory Visit
Admit: 2018-03-31 | Discharge: 2018-04-07 | Disposition: A | Discharge status: Home / Self Care | Payer: MEDICARE | Admitting: Hematology & Oncology

## 2018-03-31 ENCOUNTER — Ambulatory Visit
Admit: 2018-03-31 | Discharge: 2018-04-07 | Disposition: A | Discharge status: Home / Self Care | Payer: MEDICARE | Attending: Student in an Organized Health Care Education/Training Program | Admitting: Hematology & Oncology

## 2018-03-31 DIAGNOSIS — D599 Acquired hemolytic anemia, unspecified: Secondary | ICD-10-CM

## 2018-03-31 DIAGNOSIS — C9210 Chronic myeloid leukemia, BCR/ABL-positive, not having achieved remission: Secondary | ICD-10-CM

## 2018-03-31 DIAGNOSIS — E46 Unspecified protein-calorie malnutrition: Principal | ICD-10-CM

## 2018-04-02 DIAGNOSIS — E46 Unspecified protein-calorie malnutrition: Principal | ICD-10-CM

## 2018-04-03 DIAGNOSIS — E46 Unspecified protein-calorie malnutrition: Principal | ICD-10-CM

## 2018-04-05 DIAGNOSIS — E46 Unspecified protein-calorie malnutrition: Principal | ICD-10-CM

## 2018-04-06 DIAGNOSIS — E46 Unspecified protein-calorie malnutrition: Principal | ICD-10-CM

## 2018-04-07 MED ORDER — POTASSIUM CHLORIDE ER 20 MEQ TABLET,EXTENDED RELEASE(PART/CRYST)
ORAL_TABLET | Freq: Every day | ORAL | 11 refills | 0 days | Status: CP
Start: 2018-04-07 — End: 2019-04-07

## 2018-04-07 MED ORDER — MAGNESIUM 71.5 MG (MAGNESIUM CHLORIDE) TABLET,DELAYED RELEASE
ORAL_TABLET | Freq: Two times a day (BID) | ORAL | 0 refills | 0 days | Status: CP
Start: 2018-04-07 — End: ?

## 2018-04-07 MED ORDER — SUCRALFATE 1 GRAM TABLET
ORAL_TABLET | Freq: Four times a day (QID) | ORAL | 0 refills | 0 days | Status: CP
Start: 2018-04-07 — End: 2018-05-07

## 2018-04-07 MED ORDER — GABAPENTIN 300 MG CAPSULE
ORAL_CAPSULE | Freq: Two times a day (BID) | ORAL | 0 refills | 0.00000 days | Status: CP
Start: 2018-04-07 — End: 2018-08-03

## 2018-04-07 MED ORDER — POTASSIUM, SODIUM PHOSPHATES 280 MG-160 MG-250 MG ORAL POWDER PACKET
PACK | Freq: Two times a day (BID) | ORAL | 0 refills | 0 days | Status: CP
Start: 2018-04-07 — End: 2018-04-07

## 2018-04-07 MED ORDER — CHOLESTYRAMINE-ASPARTAME 4 GRAM ORAL POWDER FOR SUSP IN A PACKET
PACK | Freq: Two times a day (BID) | ORAL | 0 refills | 0.00000 days | Status: CP
Start: 2018-04-07 — End: 2018-08-12

## 2018-04-07 MED ORDER — PANTOPRAZOLE 20 MG TABLET,DELAYED RELEASE
ORAL_TABLET | Freq: Two times a day (BID) | ORAL | 0 refills | 0.00000 days | Status: CP
Start: 2018-04-07 — End: 2018-05-07

## 2018-04-08 MED ORDER — PREDNISONE 10 MG TABLET
ORAL_TABLET | 0 refills | 0 days | Status: CP
Start: 2018-04-08 — End: 2018-05-07

## 2018-04-08 MED ORDER — MULTIVITAMIN TABLET
ORAL_TABLET | Freq: Every day | ORAL | 11 refills | 0.00000 days | Status: CP
Start: 2018-04-08 — End: 2019-04-08

## 2018-04-16 ENCOUNTER — Ambulatory Visit: Admit: 2018-04-16 | Discharge: 2018-04-16 | Payer: MEDICARE

## 2018-04-16 ENCOUNTER — Ambulatory Visit: Admit: 2018-04-16 | Discharge: 2018-04-16 | Payer: MEDICARE | Attending: Adult Health | Primary: Adult Health

## 2018-04-16 ENCOUNTER — Institutional Professional Consult (permissible substitution): Admit: 2018-04-16 | Discharge: 2018-04-16 | Payer: MEDICARE

## 2018-04-16 DIAGNOSIS — I10 Essential (primary) hypertension: Secondary | ICD-10-CM

## 2018-04-16 DIAGNOSIS — E878 Other disorders of electrolyte and fluid balance, not elsewhere classified: Principal | ICD-10-CM

## 2018-04-16 DIAGNOSIS — C9210 Chronic myeloid leukemia, BCR/ABL-positive, not having achieved remission: Principal | ICD-10-CM

## 2018-04-16 DIAGNOSIS — E860 Dehydration: Secondary | ICD-10-CM

## 2018-04-16 MED ORDER — CARVEDILOL 12.5 MG TABLET
ORAL_TABLET | Freq: Two times a day (BID) | ORAL | 0 refills | 0 days | Status: CP
Start: 2018-04-16 — End: 2018-08-05

## 2018-05-07 ENCOUNTER — Ambulatory Visit: Admit: 2018-05-07 | Discharge: 2018-05-08 | Payer: MEDICARE | Attending: Adult Health | Primary: Adult Health

## 2018-05-07 ENCOUNTER — Ambulatory Visit: Admit: 2018-05-07 | Discharge: 2018-05-08 | Payer: MEDICARE

## 2018-05-07 DIAGNOSIS — C9210 Chronic myeloid leukemia, BCR/ABL-positive, not having achieved remission: Principal | ICD-10-CM

## 2018-05-07 DIAGNOSIS — E860 Dehydration: Principal | ICD-10-CM

## 2018-05-24 MED ORDER — ALLOPURINOL 100 MG TABLET
ORAL_TABLET | 4 refills | 0 days | Status: CP
Start: 2018-05-24 — End: 2018-08-03

## 2018-05-26 MED ORDER — ALLOPURINOL 100 MG TABLET
ORAL_TABLET | Freq: Every day | ORAL | 4 refills | 0.00000 days | Status: CP
Start: 2018-05-26 — End: 2018-08-03

## 2018-05-26 MED ORDER — ALLOPURINOL 100 MG TABLET: 100 mg | tablet | Freq: Every day | 4 refills | 0 days | Status: AC

## 2018-06-30 MED ORDER — IMATINIB 100 MG TABLET
ORAL_TABLET | 0 refills | 0 days | Status: CP
Start: 2018-06-30 — End: 2019-02-18

## 2018-08-03 ENCOUNTER — Ambulatory Visit: Admit: 2018-08-03 | Discharge: 2018-08-03 | Payer: MEDICARE | Attending: Adult Health | Primary: Adult Health

## 2018-08-03 ENCOUNTER — Ambulatory Visit: Admit: 2018-08-03 | Discharge: 2018-08-03 | Payer: MEDICARE

## 2018-08-03 ENCOUNTER — Other Ambulatory Visit: Admit: 2018-08-03 | Discharge: 2018-08-03 | Payer: MEDICARE

## 2018-08-03 DIAGNOSIS — C9210 Chronic myeloid leukemia, BCR/ABL-positive, not having achieved remission: Secondary | ICD-10-CM

## 2018-08-03 DIAGNOSIS — Z95828 Presence of other vascular implants and grafts: Secondary | ICD-10-CM

## 2018-08-03 DIAGNOSIS — D599 Acquired hemolytic anemia, unspecified: Principal | ICD-10-CM

## 2018-08-03 DIAGNOSIS — I10 Essential (primary) hypertension: Secondary | ICD-10-CM

## 2018-08-03 MED ORDER — GABAPENTIN 300 MG CAPSULE
ORAL_CAPSULE | 2 refills | 0 days | Status: CP
Start: 2018-08-03 — End: 2018-08-25

## 2018-08-03 MED ORDER — LIDOCAINE-PRILOCAINE 2.5 %-2.5 % TOPICAL CREAM
Freq: Once | TOPICAL | 1 refills | 0 days | Status: CP
Start: 2018-08-03 — End: 2018-08-03

## 2018-08-03 MED ORDER — CARVEDILOL 12.5 MG TABLET
ORAL_TABLET | Freq: Two times a day (BID) | ORAL | 11 refills | 0 days | Status: CP
Start: 2018-08-03 — End: 2019-08-03

## 2018-08-05 MED ORDER — CARVEDILOL 12.5 MG TABLET
ORAL_TABLET | 0 refills | 0 days | Status: CP
Start: 2018-08-05 — End: 2018-08-12

## 2018-08-12 ENCOUNTER — Ambulatory Visit: Admit: 2018-08-12 | Discharge: 2018-08-12 | Payer: MEDICARE

## 2018-08-12 ENCOUNTER — Ambulatory Visit
Admit: 2018-08-12 | Discharge: 2018-08-12 | Payer: MEDICARE | Attending: Student in an Organized Health Care Education/Training Program | Primary: Student in an Organized Health Care Education/Training Program

## 2018-08-12 DIAGNOSIS — D638 Anemia in other chronic diseases classified elsewhere: Secondary | ICD-10-CM

## 2018-08-12 DIAGNOSIS — K50011 Crohn's disease of small intestine with rectal bleeding: Secondary | ICD-10-CM

## 2018-08-12 DIAGNOSIS — C9210 Chronic myeloid leukemia, BCR/ABL-positive, not having achieved remission: Principal | ICD-10-CM

## 2018-08-12 DIAGNOSIS — D599 Acquired hemolytic anemia, unspecified: Secondary | ICD-10-CM

## 2018-08-12 DIAGNOSIS — K922 Gastrointestinal hemorrhage, unspecified: Secondary | ICD-10-CM

## 2018-08-16 MED ORDER — NILOTINIB 150 MG CAPSULE
ORAL_CAPSULE | Freq: Two times a day (BID) | ORAL | 2 refills | 0.00000 days | Status: CP
Start: 2018-08-16 — End: 2018-10-15

## 2018-08-16 NOTE — Unmapped (Signed)
Message   Received: 2 days ago   Message Contents   Delora Fuel, MD  Arnetha Massy, RN      ??      Tasigna 300mg  BID     Will need EKG before starting      Will be e-scribed to [facility] for initial processing

## 2018-08-25 MED ORDER — GABAPENTIN 300 MG CAPSULE
ORAL_CAPSULE | 1 refills | 0 days | Status: CP
Start: 2018-08-25 — End: 2019-02-08

## 2018-09-04 NOTE — Unmapped (Addendum)
Returned patient's call regarding PCP referral. She states she received a call to set up appointment with Monica Martinez, PA however the office was located in [place]. Advised patient that I would look into this and call her back. Patient verbalized understanding.     Called [facility] at [facility] [phone]) and spoke with the front desk who confirmed Monica Martinez is at their office which is located in [place].    Called patient and left a message advising of the above. Provided her with the practices phone number to call and set up an appointment. Advised her to call with any other questions or concerns.

## 2018-09-09 NOTE — Unmapped (Signed)
Encounter opened in error. Please disregard.

## 2018-09-16 NOTE — Unmapped (Signed)
Patient has not returned calls from [facility] regarding manufacturer assistance for Tasigna.    Called patient and had to leave a detailed voice message. Asked patient to call back in regards to Tasigna and also to schedule a follow up with Dr. Sanda Klein soon, as requested by Dr. Sanda Klein. Given office contact number.

## 2018-10-15 DIAGNOSIS — C9210 Chronic myeloid leukemia, BCR/ABL-positive, not having achieved remission: Principal | ICD-10-CM

## 2018-10-15 MED ORDER — NILOTINIB 150 MG CAPSULE
ORAL_CAPSULE | Freq: Two times a day (BID) | ORAL | 2 refills | 0 days | Status: CP
Start: 2018-10-15 — End: 2018-12-24

## 2018-10-15 NOTE — Unmapped (Signed)
Called and spoke to patient regarding prescription for Tasigna and that [facility] had been trying to call her to pursue Financial Assistance, but patient had not returned their calls.    Patient states that she would prefer that the prescription for Tasigna goes to [facility] (where her prior Gleevec rx was coming from) and where she had a grant that paid for the medication in the past.    Informed patient that we can send the new Rx for Tasigna to [facility].  Patient has the contact number for the [facility] and will call them next week to coordinate.    Given patient my direct contact ph# for any issues with the prescription.

## 2018-10-15 NOTE — Unmapped (Signed)
Request for new Tasigna Rx to be escribed to [facility].    Routed to Dr. Sanda Klein.

## 2018-10-21 ENCOUNTER — Ambulatory Visit: Admit: 2018-10-21 | Discharge: 2018-10-21 | Payer: MEDICARE

## 2018-10-21 ENCOUNTER — Ambulatory Visit
Admit: 2018-10-21 | Discharge: 2018-10-21 | Payer: MEDICARE | Attending: Student in an Organized Health Care Education/Training Program | Primary: Student in an Organized Health Care Education/Training Program

## 2018-10-21 ENCOUNTER — Encounter
Admit: 2018-10-21 | Discharge: 2018-10-21 | Payer: MEDICARE | Attending: Student in an Organized Health Care Education/Training Program | Primary: Student in an Organized Health Care Education/Training Program

## 2018-10-21 DIAGNOSIS — C9210 Chronic myeloid leukemia, BCR/ABL-positive, not having achieved remission: Principal | ICD-10-CM

## 2018-10-21 LAB — CBC W/ AUTO DIFF
BASOPHILS ABSOLUTE COUNT: 0 10*9/L (ref 0.0–0.1)
BASOPHILS RELATIVE PERCENT: 0.8 %
EOSINOPHILS ABSOLUTE COUNT: 0.1 10*9/L (ref 0.0–0.7)
EOSINOPHILS RELATIVE PERCENT: 1.3 %
HEMATOCRIT: 28.5 % — ABNORMAL LOW (ref 35.0–44.0)
HEMOGLOBIN: 9.2 g/dL — ABNORMAL LOW (ref 12.0–15.5)
LYMPHOCYTES ABSOLUTE COUNT: 1.6 10*9/L (ref 0.7–4.0)
LYMPHOCYTES RELATIVE PERCENT: 32.6 %
MEAN CORPUSCULAR VOLUME: 89.7 fL (ref 82.0–98.0)
MEAN PLATELET VOLUME: 6.2 fL — ABNORMAL LOW (ref 7.0–10.0)
MEAN PLATELET VOLUME: 6.2 fL — ABNORMAL LOW (ref 7.0–10.0)
MONOCYTES ABSOLUTE COUNT: 0.5 10*9/L (ref 0.1–1.0)
MONOCYTES RELATIVE PERCENT: 11.1 %
NEUTROPHILS ABSOLUTE COUNT: 2.7 10*9/L (ref 1.7–7.7)
NEUTROPHILS RELATIVE PERCENT: 54.2 %
PLATELET COUNT: 297 10*9/L (ref 150–450)
RED BLOOD CELL COUNT: 3.18 10*12/L — ABNORMAL LOW (ref 3.90–5.03)
RED CELL DISTRIBUTION WIDTH: 13.1 % (ref 12.0–15.0)
WBC ADJUSTED: 4.9 10*9/L (ref 3.5–10.5)

## 2018-10-21 LAB — COMPREHENSIVE METABOLIC PANEL
ALBUMIN: 3.7 g/dL (ref 3.5–5.0)
ALKALINE PHOSPHATASE: 116 U/L (ref 50–136)
ALT (SGPT): 12 U/L (ref 12–78)
ANION GAP: 9 mmol/L (ref 3–11)
BILIRUBIN TOTAL: 0.7 mg/dL (ref 0.2–1.0)
BLOOD UREA NITROGEN: 22 mg/dL — ABNORMAL HIGH (ref 10–20)
BUN / CREAT RATIO: 14
BUN / CREAT RATIO: 14 mg/dL — ABNORMAL HIGH (ref 10–20)
CALCIUM: 10 mg/dL (ref 8.5–10.1)
CHLORIDE: 105 mmol/L (ref 98–107)
CO2: 24.9 mmol/L (ref 21.0–32.0)
CREATININE: 1.58 mg/dL — ABNORMAL HIGH (ref 0.60–1.10)
EGFR CKD-EPI AA FEMALE: 39 mL/min/{1.73_m2}
GLUCOSE RANDOM: 103 mg/dL — ABNORMAL HIGH (ref 65–99)
POTASSIUM: 3.7 mmol/L (ref 3.5–5.0)
PROTEIN TOTAL: 7.6 g/dL (ref 6.0–8.0)
SODIUM: 139 mmol/L (ref 135–145)

## 2018-10-21 NOTE — Unmapped (Signed)
[facility]  Note      Patient Name:  Katherine Hayes  Date of Birth:  Oct 04, 1955  Date of Encounter:  10/21/2018    Referring Provider:  Katheran James, *  PCP:  Katheran James, MD      Reason for Visit:   1. CML    Assessment:   This is 63 year old female with a history of CML, treated with Gleevec.  She was previously treated in [place], [place], and transitioned her care to [place], [place] in November 2015. She had been on gleevec 300mg , dose reduced for recurrent thrombocytopenia. Last dose was in Oct 2019. She stopped taking it during last hospitalization and has not resumed to date.     She was hospitalized at [facility] for a GI bleed and diarrhea in 12/2017. She required 2u pRBC. She did not undergo scopes as the bleeding spontaneously ceased. She was set up for EGD/Cscope with Dr. Marin Olp as an outpatient. EGD 03/09/18 showed healing esophagitis and a normal stomach and duodenum. Bx showed reactive gastropathy with neg H pylori. Small bowel bx were negative for features of celiac disease. Colonoscopy findings 03/09/18 described as diffuse mild mucosal changes throughout the entire examine colon concerning for edema/colitis. This was thought to possible be related to her Gleevec.    She was admitted 04/01/18 due to worsening diarrhea and malabsorption. CT showed ileal wall thickening extending to the level of the terminal ileum. No colonic involvement was noted. Flex sigmoidoscopy 8/30 showed localized mild inflammation in the proximal sigmoid colon. Biopsies were taken which showed normal mucosa. Her gleevec has been held since that hospitalization.     Her symptoms have significantly improved since stopping the gleevec. The plan was to switch her therapy to Tasigna to see if this is better tolerated, however, due to communication issues, she just received the drug. She started it this morning.      Plan:  1. CML:     - BCR-ABL 0.023 -- 01/27/18. Remains in MMR   - BCR-ABL 0.238 -- Start Tasgina 300mg  BID today (10/21/18)   - EKG 1 week after Tasigna  2. H/O Hemolytic anemia:    - very extensive work up detailed below. Has been seen at [facility]. Hemolysis was felt to be minimal   - Normal LDH and bili, undetectable hapto.  3. GI bleed: S/P admission to [place] for transfusion, GI consult, electrolyte replacement. Mg 1.2 (improved from 0.4)  4. Diarrhea: C diff negative. Infectious work up negative. ?Related to Gleevec   - ?malasorptive diarrhea -- Still having intermittent diarrhea but now only 2 times per day - Refer to [facility]  5. AKI: noted -- Could be combo of anemia, dehydration leading to intrinsic renal dysfunction. Recently had episode of dehydration in November and passed out at home- received IV hydration. Creat down to 1.64 today.  6. Headaches: continue Excedrin PRN, start allergy medication to help with sinus HA  7. Neuropathy: Taking Gabapentin with improvement    RTC - 37mo    Hematology History:    This is a 63 year old female who was initially diagnosed with CML in 2006.  She reports being on Gleevec for a number of years, but due to financial and insurance issues, she was off therapy from 2009-2013. She was seen by hematologist and initially placed on Hydrea for a rising white count. Once the patient was able to obtain Gleevec, she was started back on it. Over the last 6-12 months, she reports  increased fatigue and malaise.  She has not had her blood counts checked since May 2015. Due to financial issues, she had been off gleevec during this time. She started back on gleevec ?around 11/2013. When she was initially seen in [place], I was concerned for primary bone marrow failure (ie CML with accelerated phase or blast crisis), but a bone marrow biopsy on 06/27/14 showed normal hematopoesis, normal cytogenetics and flow. Her bcr-abl was 9% by PCR. On follow up, labs in mid-December showed hemolysis with an undetectable haptoglobin. Interesting, her LDH and bili were normal. G6PD was normal. Hemoglobin electrophoresis was normal expect for a small amount (0.9%) HgbF. More outside records \\were  obtain which indicated that she had had an undetectable haptoglobin as early as 07/2013 suggesting a chronic hemolytic process. Blood smear was again reviewed which showed 3-4 schistocytes per high-power field, c/w a microangiopathic hemolytic anemia.  An ADAMTS13 was sent and was normal. Fibrinogen was normal making DIC unlikely. PNH was sent but the tube was hemolyzed and could not be run. Cold agglutinins negative.      Interim history:  Pt presents in follow up. She is doing good. Intermittent sinus type headaches improved. BP has stabalized. Weight up. She denies any visual changes, chest pain, SOB or DOE. No s/s of bleeding.    PCR trend:  07/2013: BCR/ABL- PCR 91%  12/2013: BCR/ABL PCR???5.42%   06/2014: BCR/ABL PCR 0.9%   09/20/14: BCR/ABL PCR 15% (had been off gleevec due to count abnormalities)--> gleevec restarted at 400mg   11/22/14- gleevec held for tcp <50  02/22/15: BCR-ABL 4%   9/16- gleevec restarted at 300mg   08/01/15- BCR-ABL 0.161%   03/25/16- BCR-ABL- 0.028 IS %  05/15/16-  BCR-ABL- 0.033 IS %  09/16/16- BCR-ABL- .037%  02/10/2017: BCR-ABL- .021%  05/04/17- BCR-ABL- .027%  07/09/17: BCR-ABL- .025%  01/28/18- BCR-ABL- .023%   07/2018: BCR-ABL- 0.238      Past Medical History:  1.  CML.  2.  Gout.  3.  Hypertension.  4.  Acid reflux.  5.  Neuropathy    Past Surgical History:      She has a past surgical history that includes Radiation; Chemotherapy; Breast lumpectomy (Left, 1991); Dental surgery; and pr sigmoidoscopy,biopsy (N/A, 04/02/2018).      Allergies:     She is allergic to lisinopril.        Medications:     She has a current medication list which includes the following prescription(s): allopurinol, carvedilol, ferrous sulfate, folic acid, gabapentin, imatinib, magnesium chloride, multivitamin, nilotinib, phosphorous supplement, and potassium chloride, and the following Facility-Administered Medications: heparin, porcine (pf), heparin, porcine (pf), and heparin, porcine (pf).      Family History:    No family history of malignancy or blood disorders    Social History:     Social History     Socioeconomic History   ??? Marital status: Single     Spouse name: Not on file   ??? Number of children: Not on file   ??? Years of education: Not on file   ??? Highest education level: Not on file   Occupational History   ??? Not on file   Social Needs   ??? Financial resource strain: Not on file   ??? Food insecurity     Worry: Not on file     Inability: Not on file   ??? Transportation needs     Medical: Not on file     Non-medical: Not on file   Tobacco Use   ???  Smoking status: Never Smoker   ??? Smokeless tobacco: Never Used   Substance and Sexual Activity   ??? Alcohol use: Yes     Alcohol/week: 3.0 standard drinks     Types: 3 Glasses of wine per week   ??? Drug use: No   ??? Sexual activity: Not on file   Lifestyle   ??? Physical activity     Days per week: Not on file     Minutes per session: Not on file   ??? Stress: Not on file   Relationships   ??? Social Wellsite geologist on phone: Not on file     Gets together: Not on file     Attends religious service: Not on file     Active member of club or organization: Not on file     Attends meetings of clubs or organizations: Not on file     Relationship status: Not on file   Other Topics Concern   ??? Not on file   Social History Narrative   ??? Not on file         Review of Systems:   CONSTITUTIONAL: + fatigue, +DOE,  NO fever, chills, + weakness; + HA  HEENT: Eyes: No visual loss, blurred vision, double vision or yellow sclerae. Ears, Nose, Throat: No hearing loss, sneezing, congestion, runny nose or sore throat.   SKIN: No rash or itching.   CARDIOVASCULAR: No chest pain, chest pressure or chest discomfort. No palpitations or edema.   RESPIRATORY: No shortness of breath, cough or sputum.   GASTROINTESTINAL: Diarrhea. 8x per day  NEUROLOGICAL: + headache, no syncope, paralysis, ataxia, numbness or tingling in the extremities. No change in bowel or bladder control.   MUSCULOSKELETAL: No muscle, + upper back pain, no joint pain or stiffness.   HEMATOLOGIC: + anemia, bleeding or bruising.   LYMPHATICS: No enlarged nodes. No history of splenectomy.   PSYCHIATRIC: No history of depression or anxiety.   ENDOCRINOLOGIC: No reports of sweating, cold or heat intolerance. No polyuria or polydipsia.   ALLERGIES: No history of asthma, hives, eczema or rhinitis.  Neuro: Worsening PN in fingers and toes    Physical Exam:    Vitals: There were no vitals taken for this visit. Manual BP was 160/90     General:  Pleasant, no acute distress.    ECOG 0  Pain  Pain Evaluation:                                    Pain Score (0 - 1):                                Pain Score (Wong-Baker Faces):        Pain Location:                                        Pain Frequency:                                        Distress screening was reviewed and discussed during consult, and No additional action taken. .     Eyes:  EOMI, PERRL, sclerae anicteric,  conjunctivae pink.    HENT:  Atraumatic. Poor dentition-Teeth in process of being extracted   Neck:  Supple, thyroid midline.  Cardiovascular:  HR 106-no m/r/g. No LE edema x2.  Respiratory:  CTAB. Unlabored.     Gastrointestinal:  Soft and nontender, no masses or HSM.    Skin:  No rashes or subcutaneous nodules.    Musculoskeletal:  No bony pain or tenderness.  Nl gait.      Psychiatric:  Affect appropriate.  Judgment and insight nl.  Neurologic:  Alert and oriented x3.  Grossly non-focal.          Labs:   I personally reviewed the following labs.    Lab Results   Component Value Date    WBC 5.4 08/12/2018    HGB 9.8 (L) 08/12/2018    HCT 29.1 (L) 08/12/2018    PLT 207 08/12/2018       Lab Results   Component Value Date    NA 142 08/12/2018    K 3.7 08/12/2018    CL 107 08/12/2018    CO2 22.5 08/12/2018    BUN 32 (H) 08/12/2018    CREATININE 1.64 (H) 08/12/2018    GLU 89 08/12/2018    CALCIUM 9.2 08/12/2018    MG 1.5 08/12/2018    PHOS 2.0 (L) 04/07/2018       Lab Results   Component Value Date    BILITOT 0.5 08/12/2018    BILIDIR 0.3 07/11/2014    PROT 7.4 08/12/2018    ALBUMIN 3.4 (L) 08/12/2018    ALT 18 08/12/2018    AST 23 08/12/2018    ALKPHOS 151 (H) 08/12/2018       Lab Results   Component Value Date    LABPROT 13.0 07/11/2014    INR 0.87 (L) 04/06/2018    APTT 26 07/11/2014         Results for orders placed or performed in visit on 08/12/18   Comprehensive Metabolic Panel   Result Value Ref Range    Sodium 142 135 - 145 mmol/L    Potassium 3.7 3.5 - 5.0 mmol/L    Chloride 107 98 - 107 mmol/L    CO2 22.5 21.0 - 32.0 mmol/L    BUN 32 (H) 10 - 20 mg/dL    Creatinine 1.61 (H) 0.60 - 1.10 mg/dL    BUN/Creatinine Ratio 20     EGFR CKD-EPI Non-African American, Female 33 mL/min/1.49m2    EGFR CKD-EPI African American, Female 38 mL/min/1.42m2    Glucose 89 65 - 99 mg/dL    Calcium 9.2 8.5 - 09.6 mg/dL    Albumin 3.4 (L) 3.5 - 5.0 g/dL    Total Protein 7.4 6.0 - 8.0 g/dL    Total Bilirubin 0.5 0.2 - 1.0 mg/dL    AST 23 15 - 40 U/L    ALT 18 12 - 78 U/L    Alkaline Phosphatase 151 (H) 50 - 136 U/L    Anion Gap 13 (H) 3 - 11 mmol/L   Magnesium Level   Result Value Ref Range    Magnesium 1.5 1.5 - 2.1 mg/dL   CBC w/ Differential   Result Value Ref Range    WBC 5.4 3.5 - 10.5 10*9/L    RBC 3.04 (L) 3.90 - 5.03 10*12/L    HGB 9.8 (L) 12.0 - 15.5 g/dL    HCT 04.5 (L) 40.9 - 44.0 %    MCV 96.0 82.0 - 98.0 fL    MCH 32.4 26.0 -  34.0 pg    MCHC 33.7 30.0 - 36.0 g/dL    RDW 56.2 13.0 - 86.5 %    MPV 6.2 (L) 7.0 - 10.0 fL    Platelet 207 150 - 450 10*9/L    Neutrophils % 53.0 %    Lymphocytes % 33.8 %    Monocytes % 10.8 %    Eosinophils % 1.6 %    Basophils % 0.8 %    Absolute Neutrophils 2.9 1.7 - 7.7 10*9/L    Absolute Lymphocytes 1.8 0.7 - 4.0 10*9/L    Absolute Monocytes 0.6 0.1 - 1.0 10*9/L    Absolute Eosinophils 0.1 0.0 - 0.7 10*9/L    Absolute Basophils 0.0 0.0 - 0.1 10*9/L   BCR/ABL1 p210 Blood   Result Value Ref Range    Collection Collected    BCR/ABL1 p210 Blood   Result Value Ref Range    Case Report       Molecular Genetics Report                         Case: [id number]                                 Authorizing Provider:  Delora Fuel, MD       Collected:           08/12/2018 0925              Ordering Location:     [facility]    Received:            08/13/2018 1214                                     [facility]                                                       Pathologist:           Claudia Desanctis,                                                                            MD                                                                           Specimen:    Blood  Specimen Type Blood     BCR/ABL1 p210 Assay Positive     BCR/ABL1 p210 IS% Ratio 0.290     BCR/ABL1 p210 Assay Results       RESULTS:  BCR-ABL1 p210 transcripts were detected at a level of 0.290 IS % ratio in blood.    INTERPRETATION:  Detection of BCR-ABL1 p210 RNA suggests the presence of cells containing t(9;22).    COMMENT:  This result is not significantly different from the previous result of 0.238 IS % ratio in blood on 08/03/18. In serial measurements, a ten-fold change is considered to be clinically significant.     Per clinical notes, the patient is not currently taking a tyrosine kinase inhibitor, which may explain the elevated BCR-ABL1 p210 level.   However, if there is clinical concern for a drug resistance mutation, testing for such mutation is available on the existing sample, if appropriate.    REFERENCES:  Clin Haematol. 1997; 10(2):203-22.   Clin Cancer Res 2005; 11: 3425.  NCCN Clinical Practice Guidelines [email or web address][email or web address]   Mod Pathol 17: 96-103, 2004; Leukemia 17: 2318-2357, 2003.    METHOD:  Reverse transcription polymerase chain reaction was performed on an ABI 7900 instrument using primers and TaqMan probe targeting BCR-ABL1 p210 and ABL1 control gene cDNA. Assay sensitivity is estimated at one cell per 100,000 cells which is equivalent to 0.001 IS % ratio on the international scale. The reference range for this test is: No BCR-ABL p210 RNA detected.      This test was developed and its performance characteristics determined by the [facility]. It has not been cleared by the [place] Food and Drug Administration. However, such approval is not required for clinical implementation, and test results have been shown to be clinically useful. This laboratory is CAP accredited and CLIA certified to perform high complexity testing.           The patient reports that all of her questions were answered to her satisfaction today.  She was encouraged to contact us for any questions that may arise prior to her next visit.    Thank you very much for allowing me to participate in her care.

## 2018-10-26 NOTE — Unmapped (Signed)
Encounter opened in error. Please disregard.

## 2018-10-27 NOTE — Unmapped (Signed)
Placed call to patient to follow up after patient started Tasigna on 10/21/18.   Had to leave voice message, asking patient to call back with any questions or concerns. Patient has labs and follow up on 11/26/18 with Dr. Sanda Klein.  Contact information provided.

## 2018-11-26 ENCOUNTER — Ambulatory Visit
Admit: 2018-11-26 | Discharge: 2018-11-27 | Payer: MEDICARE | Attending: Student in an Organized Health Care Education/Training Program | Primary: Student in an Organized Health Care Education/Training Program

## 2018-12-21 ENCOUNTER — Ambulatory Visit: Admit: 2018-12-21 | Discharge: 2018-12-21 | Payer: MEDICARE

## 2018-12-21 ENCOUNTER — Encounter
Admit: 2018-12-21 | Discharge: 2018-12-21 | Payer: MEDICARE | Attending: Student in an Organized Health Care Education/Training Program | Primary: Student in an Organized Health Care Education/Training Program

## 2018-12-21 ENCOUNTER — Institutional Professional Consult (permissible substitution): Admit: 2018-12-21 | Discharge: 2018-12-21 | Payer: MEDICARE | Attending: Adult Health | Primary: Adult Health

## 2018-12-21 DIAGNOSIS — C9210 Chronic myeloid leukemia, BCR/ABL-positive, not having achieved remission: Principal | ICD-10-CM

## 2018-12-21 LAB — POTASSIUM: Chemistry studies:Cmplx:-:^Patient:Set:: 4.1

## 2018-12-21 LAB — ANISOCYTOSIS

## 2018-12-21 LAB — COMPREHENSIVE METABOLIC PANEL
ALKALINE PHOSPHATASE: 184 U/L — ABNORMAL HIGH (ref 50–136)
ALT (SGPT): 57 U/L (ref 12–78)
ANION GAP: 13 mmol/L — ABNORMAL HIGH (ref 3–11)
AST (SGOT): 30 U/L (ref 15–40)
BILIRUBIN TOTAL: 2.2 mg/dL — ABNORMAL HIGH (ref 0.2–1.0)
BLOOD UREA NITROGEN: 25 mg/dL — ABNORMAL HIGH (ref 10–20)
BUN / CREAT RATIO: 15
CALCIUM: 10.1 mg/dL (ref 8.5–10.1)
CHLORIDE: 107 mmol/L (ref 98–107)
CO2: 21.4 mmol/L (ref 21.0–32.0)
EGFR CKD-EPI AA FEMALE: 36 mL/min/{1.73_m2}
EGFR CKD-EPI NON-AA FEMALE: 32 mL/min/{1.73_m2}
GLUCOSE RANDOM: 101 mg/dL — ABNORMAL HIGH (ref 65–99)
POTASSIUM: 4.1 mmol/L (ref 3.5–5.0)
PROTEIN TOTAL: 7.7 g/dL (ref 6.0–8.0)
SODIUM: 141 mmol/L (ref 135–145)

## 2018-12-21 LAB — CBC W/ AUTO DIFF
BASOPHILS ABSOLUTE COUNT: 0 10*9/L (ref 0.0–0.1)
BASOPHILS RELATIVE PERCENT: 0.2 %
EOSINOPHILS ABSOLUTE COUNT: 0.1 10*9/L (ref 0.0–0.7)
EOSINOPHILS RELATIVE PERCENT: 1.3 %
HEMATOCRIT: 29 % — ABNORMAL LOW (ref 35.0–44.0)
HEMOGLOBIN: 9.5 g/dL — ABNORMAL LOW (ref 12.0–15.5)
LYMPHOCYTES ABSOLUTE COUNT: 1.5 10*9/L (ref 0.7–4.0)
MEAN CORPUSCULAR HEMOGLOBIN CONC: 32.7 g/dL (ref 30.0–36.0)
MEAN CORPUSCULAR HEMOGLOBIN: 28.8 pg (ref 26.0–34.0)
MEAN PLATELET VOLUME: 7 fL (ref 7.0–10.0)
MONOCYTES ABSOLUTE COUNT: 0.5 10*9/L (ref 0.1–1.0)
MONOCYTES RELATIVE PERCENT: 7.5 %
NEUTROPHILS ABSOLUTE COUNT: 4.8 10*9/L (ref 1.7–7.7)
NEUTROPHILS RELATIVE PERCENT: 69 %
PLATELET COUNT: 277 10*9/L (ref 150–450)
RED BLOOD CELL COUNT: 3.29 10*12/L — ABNORMAL LOW (ref 3.90–5.03)
RED CELL DISTRIBUTION WIDTH: 16.5 % — ABNORMAL HIGH (ref 12.0–15.0)
WBC ADJUSTED: 6.9 10*9/L (ref 3.5–10.5)

## 2018-12-21 LAB — BILIRUBIN DIRECT: Chemistry studies:Cmplx:-:^Patient:Set:: 0.8 — ABNORMAL HIGH

## 2018-12-21 NOTE — Unmapped (Signed)
[facility]  Note      Patient Name:  Katherine Hayes  Date of Birth:  11-12-1955  Date of Encounter:  12/21/2018    Referring Provider:  Katheran James, *  PCP:  Katheran James, MD      Reason for Visit:   1. CML    Assessment:   This is 63 year old female with a history of CML, treated with Gleevec.  She was previously treated in [place], [place], and transitioned her care to [place], [place] in November 2015. She had been on gleevec 300mg , dose reduced for recurrent thrombocytopenia. Last dose was in Oct 2019. She stopped taking it during last hospitalization and has not resumed to date.     She was hospitalized at [facility] for a GI bleed and diarrhea in 12/2017. She required 2u pRBC. She did not undergo scopes as the bleeding spontaneously ceased. She was set up for EGD/Cscope with Dr. Marin Olp as an outpatient. EGD 03/09/18 showed healing esophagitis and a normal stomach and duodenum. Bx showed reactive gastropathy with neg H pylori. Small bowel bx were negative for features of celiac disease. Colonoscopy findings 03/09/18 described as diffuse mild mucosal changes throughout the entire examine colon concerning for edema/colitis. This was thought to possible be related to her Gleevec.    She was admitted 04/01/18 due to worsening diarrhea and malabsorption. CT showed ileal wall thickening extending to the level of the terminal ileum. No colonic involvement was noted. Flex sigmoidoscopy 8/30 showed localized mild inflammation in the proximal sigmoid colon. Biopsies were taken which showed normal mucosa. Her gleevec has been held since that hospitalization.     Her symptoms have significantly improved since stopping the gleevec.    Started Tasigna in 10/2018.    Plan:  1. CML:     - BCR-ABL 0.023 -- 01/27/18. Remains in MMR   - BCR-ABL 0.238 -- Continue Tasigna 300mg  BID ( started 10/21/18)              - BCR-ABL 0.292--   Continue Tasigna 300mg  BID     2. H/O Hemolytic anemia:    - very extensive work up detailed below. Has been seen at [facility]. Hemolysis was felt to be minimal   -.Increased bili today- will check [place] RUQ.    3. GI bleed: S/P admission to [place] for transfusion, GI consult, electrolyte replacement. Mg 1.2 (improved from 0.4)  4. Diarrhea: C diff negative. Infectious work up negative. ?Related to Gleevec   - ?malasorptive diarrhea -- Still having intermittent diarrhea but now only 2 times per day - Refer to [facility]  5. AKI: noted -- Could be combo of anemia, dehydration leading to intrinsic renal dysfunction. Recently had episode of dehydration in November and passed out at home- received IV hydration. Creat stable at 1.6 today.  6. Headaches: continue Excedrin PRN, start allergy medication to help with sinus HA  7. Neuropathy: Taking Gabapentin with improvement  8. Elevated bilirubin-check US abdomen.     RTC - 38mo    Hematology History:    This is a 63 year old female who was initially diagnosed with CML in 2006.  She reports being on Gleevec for a number of years, but due to financial and insurance issues, she was off therapy from 2009-2013. She was seen by hematologist and initially placed on Hydrea for a rising white count. Once the patient was able to obtain Gleevec, she was started back on it. Over the last 6-12 months, she reports  increased fatigue and malaise.  She has not had her blood counts checked since May 2015. Due to financial issues, she had been off gleevec during this time. She started back on gleevec ?around 11/2013. When she was initially seen in [place], I was concerned for primary bone marrow failure (ie CML with accelerated phase or blast crisis), but a bone marrow biopsy on 06/27/14 showed normal hematopoesis, normal cytogenetics and flow. Her bcr-abl was 9% by PCR. On follow up, labs in mid-December showed hemolysis with an undetectable haptoglobin. Interesting, her LDH and bili were normal. G6PD was normal. Hemoglobin electrophoresis was normal expect for a small amount (0.9%) HgbF. More outside records \\were  obtain which indicated that she had had an undetectable haptoglobin as early as 07/2013 suggesting a chronic hemolytic process. Blood smear was again reviewed which showed 3-4 schistocytes per high-power field, c/w a microangiopathic hemolytic anemia.  An ADAMTS13 was sent and was normal. Fibrinogen was normal making DIC unlikely. PNH was sent but the tube was hemolyzed and could not be run. Cold agglutinins negative.      Interim history:Pt presents for follow up phone visit. She is doing ok and has been compliant with her medication. She is chronically fatigued but denies any chest pain, acute SOB or DOE. No signs of GI bleeding. HGB stable at 9.5.  She does have an elevated bilirubin today. Denies any new abdominal pain or bloating. She will have an abdominal US later this week.     PCR trend:  07/2013: BCR/ABL- PCR 91%  12/2013: BCR/ABL PCR???5.42%   06/2014: BCR/ABL PCR 0.9%   09/20/14: BCR/ABL PCR 15% (had been off gleevec due to count abnormalities)--> gleevec restarted at 400mg   11/22/14- gleevec held for tcp <50  02/22/15: BCR-ABL 4%   9/16- gleevec restarted at 300mg   08/01/15- BCR-ABL 0.161%   03/25/16- BCR-ABL- 0.028 IS %  05/15/16-  BCR-ABL- 0.033 IS %  09/16/16- BCR-ABL- .037%  02/10/2017: BCR-ABL- .021%  05/04/17- BCR-ABL- .027%  07/09/17: BCR-ABL- .025%  01/28/18- BCR-ABL- .023%   07/2018: BCR-ABL- 0.238  12/2018: BCR-ABL- 0.29%      Past Medical History:  1.  CML.  2.  Gout.  3.  Hypertension.  4.  Acid reflux.  5.  Neuropathy    Past Surgical History:      She has a past surgical history that includes Radiation; Chemotherapy; Breast lumpectomy (Left, 1991); Dental surgery; and pr sigmoidoscopy,biopsy (N/A, 04/02/2018).      Allergies:     She is allergic to lisinopril.        Medications:     She has a current medication list which includes the following prescription(s): allopurinol, carvedilol, ferrous sulfate, folic acid, gabapentin, imatinib, magnesium chloride, multivitamin, nilotinib, phosphorous supplement, and potassium chloride, and the following Facility-Administered Medications: heparin, porcine (pf), heparin, porcine (pf), and heparin, porcine (pf).      Family History:    No family history of malignancy or blood disorders    Social History:     Social History     Socioeconomic History   ??? Marital status: Single     Spouse name: Not on file   ??? Number of children: Not on file   ??? Years of education: Not on file   ??? Highest education level: Not on file   Occupational History   ??? Not on file   Social Needs   ??? Financial resource strain: Not on file   ??? Food insecurity     Worry: Not on file  Inability: Not on file   ??? Transportation needs     Medical: Not on file     Non-medical: Not on file   Tobacco Use   ??? Smoking status: Never Smoker   ??? Smokeless tobacco: Never Used   Substance and Sexual Activity   ??? Alcohol use: Yes     Alcohol/week: 3.0 standard drinks     Types: 3 Glasses of wine per week   ??? Drug use: No   ??? Sexual activity: Not on file   Lifestyle   ??? Physical activity     Days per week: Not on file     Minutes per session: Not on file   ??? Stress: Not on file   Relationships   ??? Social Wellsite geologist on phone: Not on file     Gets together: Not on file     Attends religious service: Not on file     Active member of club or organization: Not on file     Attends meetings of clubs or organizations: Not on file     Relationship status: Not on file   Other Topics Concern   ??? Not on file   Social History Narrative   ??? Not on file         Review of Systems:   CONSTITUTIONAL: + fatigue,   NO fever, chills, + weakness; + int HA  HEENT: Eyes: No visual loss, blurred vision, double vision or yellow sclerae. Ears, Nose, Throat: No hearing loss, sneezing, congestion, runny nose or sore throat.   SKIN: No rash or itching.   CARDIOVASCULAR: No chest pain, chest pressure or chest discomfort. No palpitations or edema.   RESPIRATORY: No shortness of breath, cough or sputum. GASTROINTESTINAL: Int Diarrhea.  NEUROLOGICAL: + headache, no syncope, paralysis, ataxia, numbness or tingling in the extremities. No change in bowel or bladder control.   MUSCULOSKELETAL: No muscle pain, no joint pain or stiffness.   HEMATOLOGIC: + anemia, bleeding or bruising.   LYMPHATICS: No enlarged nodes. No history of splenectomy.   PSYCHIATRIC: No history of depression or anxiety.   ENDOCRINOLOGIC: No reports of sweating, cold or heat intolerance. No polyuria or polydipsia.   ALLERGIES: No history of asthma, hives, eczema or rhinitis.  Neuro:  PN in fingers and toes    Physical Exam:PHONE    Vitals: There were no vitals taken for this visit.       Distress screening was reviewed and discussed during consult, and No additional action taken. .         Labs:   I personally reviewed the following labs.    Lab Results   Component Value Date    WBC 6.9 12/21/2018    HGB 9.5 (L) 12/21/2018    HCT 29.0 (L) 12/21/2018    PLT 277 12/21/2018       Lab Results   Component Value Date    NA 141 12/21/2018    K 4.1 12/21/2018    CL 107 12/21/2018    CO2 21.4 12/21/2018    BUN 25 (H) 12/21/2018    CREATININE 1.68 (H) 12/21/2018    GLU 101 (H) 12/21/2018    CALCIUM 10.1 12/21/2018    MG 1.5 08/12/2018    PHOS 2.0 (L) 04/07/2018       Lab Results   Component Value Date    BILITOT 2.2 (H) 12/21/2018    BILIDIR 0.3 07/11/2014    PROT 7.7 12/21/2018    ALBUMIN  3.6 12/21/2018    ALT 57 12/21/2018    AST 30 12/21/2018    ALKPHOS 184 (H) 12/21/2018       Lab Results   Component Value Date    LABPROT 13.0 07/11/2014    INR 0.87 (L) 04/06/2018    APTT 26 07/11/2014         Results for orders placed or performed in visit on 12/21/18   Comprehensive Metabolic Panel   Result Value Ref Range    Sodium 141 135 - 145 mmol/L    Potassium 4.1 3.5 - 5.0 mmol/L    Chloride 107 98 - 107 mmol/L    Anion Gap 13 (H) 3 - 11 mmol/L    CO2 21.4 21.0 - 32.0 mmol/L    BUN 25 (H) 10 - 20 mg/dL    Creatinine 1.30 (H) 0.60 - 1.10 mg/dL    BUN/Creatinine Ratio 15     EGFR CKD-EPI Non-African American, Female 32 mL/min/1.55m2    EGFR CKD-EPI African American, Female 36 mL/min/1.22m2    Glucose 101 (H) 65 - 99 mg/dL    Calcium 86.5 8.5 - 78.4 mg/dL    Albumin 3.6 3.5 - 5.0 g/dL    Total Protein 7.7 6.0 - 8.0 g/dL    Total Bilirubin 2.2 (H) 0.2 - 1.0 mg/dL    AST 30 15 - 40 U/L    ALT 57 12 - 78 U/L    Alkaline Phosphatase 184 (H) 50 - 136 U/L   CBC w/ Differential   Result Value Ref Range    WBC 6.9 3.5 - 10.5 10*9/L    RBC 3.29 (L) 3.90 - 5.03 10*12/L    HGB 9.5 (L) 12.0 - 15.5 g/dL    HCT 69.6 (L) 29.5 - 44.0 %    MCV 88.1 82.0 - 98.0 fL    MCH 28.8 26.0 - 34.0 pg    MCHC 32.7 30.0 - 36.0 g/dL    RDW 28.4 (H) 13.2 - 15.0 %    MPV 7.0 7.0 - 10.0 fL    Platelet 277 150 - 450 10*9/L    Neutrophils % 69.0 %    Lymphocytes % 22.0 %    Monocytes % 7.5 %    Eosinophils % 1.3 %    Basophils % 0.2 %    Absolute Neutrophils 4.8 1.7 - 7.7 10*9/L    Absolute Lymphocytes 1.5 0.7 - 4.0 10*9/L    Absolute Monocytes 0.5 0.1 - 1.0 10*9/L    Absolute Eosinophils 0.1 0.0 - 0.7 10*9/L    Absolute Basophils 0.0 0.0 - 0.1 10*9/L    Anisocytosis Slight (A) Not Present       I spent 15 minutes on the phone with the patient. I spent an additional 15 minutes on pre- and post-visit activities.     The patient was physically located in [place] or a state in which I am permitted to provide care. The patient and/or parent/gauardian understood that s/he may incur co-pays and cost sharing, and agreed to the telemedicine visit. The visit was completed via phone and/or video, which was appropriate and reasonable under the circumstances given the patient's presentation at the time.    The patient and/or parent/guardian has been advised of the potential risks and limitations of this mode of treatment (including, but not limited to, the absence of in-person examination) and has agreed to be treated using telemedicine. The patient's/patient's family's questions regarding telemedicine have been answered.     If the phone/video visit  was completed in an ambulatory setting, the patient and/or parent/guardian has also been advised to contact their provider???s office for worsening conditions, and seek emergency medical treatment and/or call 911 if the patient deems either necessary.    The patient reports that all of her questions were answered to her satisfaction today.  She was encouraged to contact us for any questions that may arise prior to her next visit.    Thank you very much for allowing me to participate in her care.    Horris Latino, NP

## 2018-12-22 ENCOUNTER — Ambulatory Visit: Admit: 2018-12-22 | Discharge: 2018-12-23 | Payer: MEDICARE

## 2018-12-22 DIAGNOSIS — C9210 Chronic myeloid leukemia, BCR/ABL-positive, not having achieved remission: Secondary | ICD-10-CM

## 2018-12-22 DIAGNOSIS — R17 Unspecified jaundice: Principal | ICD-10-CM

## 2018-12-22 NOTE — Unmapped (Addendum)
-----   Message from Horris Latino, ANP sent at 12/22/2018  7:58 AM EDT -----  Regarding: [place]  Katherine Hayes-  Please call Katherine Hayes and let her know that based on her elevated bilirubin ( I talked to her yesterday) we are ordering a RUQ ultrasound. Will call her with results. Orders already in.     Katherine Hayes- Can we get this done this week?    Thanks,  [employer] patient and advised per above. Patient verbalized understanding and will call early next week if she has not heard about scheduling U/S. While on the phone patient states that neuropathy is worse and is becoming painful. She currently take Neurontin 1 capsule in the morning and 2 capsules in the evening. Advised I will send a message to [facility] and call back once I hear back from her.        Message   Horris Latino, ANP  Sarina Ill, RN   She can increase the gabapentin to 2 po BID        Called patient and left a VM advising per above and to call with questions or concerns.

## 2018-12-24 MED ORDER — TASIGNA 150 MG CAPSULE
ORAL_CAPSULE | 0 refills | 0 days | Status: CP
Start: 2018-12-24 — End: 2019-02-11

## 2018-12-24 NOTE — Unmapped (Signed)
Please approve [place]

## 2018-12-30 ENCOUNTER — Ambulatory Visit: Admit: 2018-12-30 | Discharge: 2018-12-31 | Payer: MEDICARE

## 2019-01-05 NOTE — Unmapped (Addendum)
-----   Message from [name], ANP -----  Regarding: Katherine Hayes-  Can you please let Jasmine December know her [place] was normal     Thanks,  [employer] patient and left a VM advising of the above and to call with any questions or concerns.

## 2019-02-02 NOTE — Unmapped (Signed)
Encounter opened in error. Please disregard.

## 2019-02-08 MED ORDER — GABAPENTIN 300 MG CAPSULE
ORAL_CAPSULE | 1 refills | 0 days | Status: CP
Start: 2019-02-08 — End: ?

## 2019-02-08 NOTE — Unmapped (Signed)
Gabapentin refill escribed and confirmed.  Patient and Dr Sanda Klein aware.

## 2019-02-11 MED ORDER — NILOTINIB 150 MG CAPSULE
ORAL_CAPSULE | Freq: Two times a day (BID) | ORAL | 0 refills | 0 days | Status: CP
Start: 2019-02-11 — End: 2019-02-22

## 2019-02-11 NOTE — Unmapped (Signed)
Tasgina script cued up and sent to Dr. Sanda Klein to approve

## 2019-02-11 NOTE — Unmapped (Signed)
-----   Message from Rozell Searing., RN sent at 02/11/2019  1:56 PM EDT -----  [facility] pharmacy called stating tasigna 150mg  script has expired and they need a new one.  Phone [phone]  Fax [phone]  Jonny Ruiz

## 2019-02-18 ENCOUNTER — Ambulatory Visit: Admit: 2019-02-18 | Discharge: 2019-02-18 | Payer: MEDICARE

## 2019-02-18 ENCOUNTER — Ambulatory Visit
Admit: 2019-02-18 | Discharge: 2019-02-18 | Payer: MEDICARE | Attending: Student in an Organized Health Care Education/Training Program | Primary: Student in an Organized Health Care Education/Training Program

## 2019-02-18 DIAGNOSIS — C9210 Chronic myeloid leukemia, BCR/ABL-positive, not having achieved remission: Principal | ICD-10-CM

## 2019-02-18 LAB — CBC W/ AUTO DIFF
BASOPHILS ABSOLUTE COUNT: 0 10*9/L (ref 0.0–0.1)
BASOPHILS RELATIVE PERCENT: 0.4 %
EOSINOPHILS ABSOLUTE COUNT: 0.1 10*9/L (ref 0.0–0.7)
HEMATOCRIT: 28.3 % — ABNORMAL LOW (ref 35.0–44.0)
HEMOGLOBIN: 9.4 g/dL — ABNORMAL LOW (ref 12.0–15.5)
LYMPHOCYTES ABSOLUTE COUNT: 1.6 10*9/L (ref 0.7–4.0)
LYMPHOCYTES RELATIVE PERCENT: 24.1 %
MEAN CORPUSCULAR HEMOGLOBIN CONC: 33.3 g/dL (ref 30.0–36.0)
MEAN CORPUSCULAR HEMOGLOBIN: 29.8 pg (ref 26.0–34.0)
MEAN PLATELET VOLUME: 6.5 fL — ABNORMAL LOW (ref 7.0–10.0)
MONOCYTES ABSOLUTE COUNT: 0.7 10*9/L (ref 0.1–1.0)
MONOCYTES RELATIVE PERCENT: 10 %
NEUTROPHILS ABSOLUTE COUNT: 4.1 10*9/L (ref 1.7–7.7)
NEUTROPHILS RELATIVE PERCENT: 64.6 %
RED BLOOD CELL COUNT: 3.16 10*12/L — ABNORMAL LOW (ref 3.90–5.03)
RED CELL DISTRIBUTION WIDTH: 17.4 % — ABNORMAL HIGH (ref 12.0–15.0)
WBC ADJUSTED: 6.5 10*9/L (ref 3.5–10.5)

## 2019-02-18 LAB — COMPREHENSIVE METABOLIC PANEL
ALBUMIN: 3.6 g/dL (ref 3.5–5.0)
ALKALINE PHOSPHATASE: 171 U/L — ABNORMAL HIGH (ref 50–136)
ALT (SGPT): 55 U/L (ref 12–78)
ANION GAP: 9 mmol/L (ref 3–11)
AST (SGOT): 35 U/L (ref 15–40)
BILIRUBIN TOTAL: 1.1 mg/dL — ABNORMAL HIGH (ref 0.2–1.0)
BLOOD UREA NITROGEN: 21 mg/dL — ABNORMAL HIGH (ref 10–20)
BUN / CREAT RATIO: 15
CALCIUM: 9.4 mg/dL (ref 8.5–10.1)
CHLORIDE: 106 mmol/L (ref 98–107)
CO2: 23.1 mmol/L (ref 21.0–32.0)
CREATININE: 1.37 mg/dL — ABNORMAL HIGH (ref 0.60–1.10)
EGFR CKD-EPI AA FEMALE: 47 mL/min/{1.73_m2}
EGFR CKD-EPI NON-AA FEMALE: 41 mL/min/{1.73_m2}
GLUCOSE RANDOM: 100 mg/dL — ABNORMAL HIGH (ref 65–99)
POTASSIUM: 4.2 mmol/L (ref 3.5–5.0)
PROTEIN TOTAL: 8 g/dL (ref 6.0–8.0)

## 2019-02-18 LAB — BLOOD UREA NITROGEN: Chemistry studies:Cmplx:-:^Patient:Set:: 21 — ABNORMAL HIGH

## 2019-02-18 LAB — MONOCYTES RELATIVE PERCENT: Lab: 10

## 2019-02-18 LAB — MAGNESIUM: Magnesium:MCnc:Pt:Ser/Plas:Qn:: 1.9

## 2019-02-22 MED ORDER — NILOTINIB 200 MG CAPSULE
ORAL_CAPSULE | Freq: Every day | ORAL | 6 refills | 30.00000 days | Status: CP
Start: 2019-02-22 — End: 2019-03-24

## 2019-02-22 NOTE — Unmapped (Signed)
[facility]  Note      Patient Name:  Katherine Hayes  Date of Birth:  October 16, 1955  Date of Encounter:  02/18/2019    Referring Provider:  Katheran James, *  PCP:  Katheran James, MD      Reason for Visit:   1. 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    Given increasing anorexia and weight loss, which started following initiation of Tasgina, will plan to dose reduce her from 300mg  BID to 400mg  daily.      Plan:  1. CML:     - Decrease Tasigna to 400mg  daily   - bcr-abl 4 --> 0.292 since initiation of Tasigna   2. H/O Hemolytic anemia:    - very extensive work up detailed below. Has been seen at [facility]. Hemolysis was felt to be minimal  3. Diarrhea: C diff negative. Infectious work up negative.  Thought to be related to Gleevec.   - diarrhea resolved following d/c gleevec   - weight was increased as was albumin   - weight down again but likely d/t tasigna.    RTC - 6 weeks      Treatment history:  1. Gleevec- discontinued 04/2018 d/t colitis  2. Tasigna 10/21/18- current    Hematology History:    This is a 63 year old female who was initially diagnosed with CML in 2006.  She reports being on Gleevec for a number of years, but due to financial and insurance issues, she was off therapy from 2009-2013. She was seen by hematologist and initially placed on Hydrea for a rising white count. Once the patient was able to obtain Gleevec, she was started back on it. Over the last 6-12 months, she reports increased fatigue and malaise.  She has not had her blood counts checked since May 2015. Due to financial issues, she had been off gleevec during this time. She started back on gleevec ?around 11/2013. When she was initially seen in [place], I was concerned for primary bone marrow failure (ie CML with accelerated phase or blast crisis), but a bone marrow biopsy on 06/27/14 showed normal hematopoesis, normal cytogenetics and flow. Her bcr-abl was 9% by PCR. On follow up, labs in mid-December showed hemolysis with an undetectable haptoglobin. Interesting, her LDH and bili were normal. G6PD was normal. Hemoglobin electrophoresis was normal expect for a small amount (0.9%) HgbF. More outside records \\were  obtain which indicated that she had had an undetectable haptoglobin as early as 07/2013 suggesting a chronic hemolytic process. Blood smear was again reviewed which showed 3-4 schistocytes per high-power field, c/w a microangiopathic hemolytic anemia.  An ADAMTS13 was sent and was normal. Fibrinogen was normal making DIC unlikely. PNH was sent but the tube was hemolyzed and could not be run. Cold agglutinins negative.      She was hospitalized at [facility] for a GI bleed and diarrhea in 12/2017. She required 2u pRBC. She did not undergo scopes as the bleeding spontaneously ceased. She was set up for EGD/Cscope with Dr. Marin Olp as an outpatient. EGD 03/09/18 showed healing esophagitis and a normal stomach and duodenum. Bx showed reactive gastropathy with neg H pylori. Small bowel bx were negative for features of celiac disease. Colonoscopy findings 03/09/18 described as diffuse mild mucosal changes throughout the entire examine colon concerning for edema/colitis. This was thought to possible be related to her Gleevec.    She  was admitted 04/01/18 due to worsening diarrhea and malabsorption. CT showed ileal wall thickening extending to the level of the terminal ileum. No colonic involvement was noted. Flex sigmoidoscopy 8/30 showed localized mild inflammation in the proximal sigmoid colon. Biopsies were taken which showed normal mucosa. Her gleevec has been held since that hospitalization.     Her symptoms have significantly improved since stopping the gleevec    Interim history:  Pt presents in follow up. She is doing good. Intermittent sinus type headaches improved. BP has stabalized. Weight up. She denies any visual changes, chest pain, SOB or DOE. No s/s of bleeding.    PCR trend:  07/2013: BCR/ABL- PCR 91%  12/2013: BCR/ABL PCR???5.42%   06/2014: BCR/ABL PCR 0.9%   09/20/14: BCR/ABL PCR 15% (had been off gleevec due to count abnormalities)--> gleevec restarted at 400mg   11/22/14- gleevec held for tcp <50  02/22/15: BCR-ABL 4%   9/16- gleevec restarted at 300mg   08/01/15- BCR-ABL 0.161%   03/25/16- BCR-ABL- 0.028 IS %  05/15/16-  BCR-ABL- 0.033 IS %  09/16/16- BCR-ABL- .037%  02/10/2017: BCR-ABL- .021%  05/04/17- BCR-ABL- .027%  07/09/17: BCR-ABL- .025%  01/28/18- BCR-ABL- .023%   07/2018: BCR-ABL- 0.238      Past Medical History:  1.  CML.  2.  Gout.  3.  Hypertension.  4.  Acid reflux.  5.  Neuropathy    Past Surgical History:      She has a past surgical history that includes Radiation; Chemotherapy; Breast lumpectomy (Left, 1991); Dental surgery; and pr sigmoidoscopy,biopsy (N/A, 04/02/2018).      Allergies:     She is allergic to lisinopril.        Medications:     She has a current medication list which includes the following prescription(s): allopurinol, carvedilol, gabapentin, nilotinib, ferrous sulfate, folic acid, magnesium chloride, multivitamin, phosphorous supplement, and potassium chloride, and the following Facility-Administered Medications: heparin, porcine (pf) and heparin, porcine (pf).      Family History:    No family history of malignancy or blood disorders    Social History:     Social History     Socioeconomic History   ??? Marital status: Single     Spouse name: None   ??? Number of children: None   ??? Years of education: None   ??? Highest education level: None   Occupational History   ??? None   Social Needs   ??? Financial resource strain: None   ??? Food insecurity     Worry: None     Inability: None   ??? Transportation needs     Medical: None     Non-medical: None   Tobacco Use   ??? Smoking status: Never Smoker   ??? Smokeless tobacco: Never Used   Substance and Sexual Activity   ??? Alcohol use: Yes     Alcohol/week: 3.0 standard drinks     Types: 3 Glasses of wine per week   ??? Drug use: No   ??? Sexual activity: None   Lifestyle   ??? Physical activity     Days per week: None     Minutes per session: None   ??? Stress: None   Relationships   ??? Social Wellsite geologist on phone: None     Gets together: None     Attends religious service: None     Active member of club or organization: None     Attends meetings of clubs or organizations: None  Relationship status: None   Other Topics Concern   ??? None   Social History Narrative   ??? None         Review of Systems:   CONSTITUTIONAL: + fatigue, +DOE,  NO fever, chills, + weakness; + HA  HEENT: Eyes: No visual loss, blurred vision, double vision or yellow sclerae. Ears, Nose, Throat: No hearing loss, sneezing, congestion, runny nose or sore throat.   SKIN: No rash or itching.   CARDIOVASCULAR: No chest pain, chest pressure or chest discomfort. No palpitations or edema.   RESPIRATORY: No shortness of breath, cough or sputum.   GASTROINTESTINAL: Diarrhea. 8x per day  NEUROLOGICAL: + headache, no syncope, paralysis, ataxia, numbness or tingling in the extremities. No change in bowel or bladder control.   MUSCULOSKELETAL: No muscle, + upper back pain, no joint pain or stiffness.   HEMATOLOGIC: + anemia, bleeding or bruising.   LYMPHATICS: No enlarged nodes. No history of splenectomy.   PSYCHIATRIC: No history of depression or anxiety.   ENDOCRINOLOGIC: No reports of sweating, cold or heat intolerance. No polyuria or polydipsia.   ALLERGIES: No history of asthma, hives, eczema or rhinitis.  Neuro: Worsening PN in fingers and toes    Physical Exam:    Vitals: BP 154/76  - Pulse 74  - Temp 36.7 ??C (98 ??F) (Temporal)  - Resp 16  - Ht 167.6 cm (5' 5.98)  - Wt 60.9 kg (134 lb 3.2 oz)  - SpO2 100%  - BMI 21.67 kg/m??  Manual BP was 160/90     General:  Pleasant, no acute distress.    ECOG 0  Pain  Pain Evaluation:                                 0-10  Pain Score (0 - 1):                             8  Pain Score (Wong-Baker Faces):        Pain Location:                                    Head, Finger, Foot(BONE PAIN)   Pain Frequency:                                Constant/continuous       Distress screening was reviewed and discussed during consult, and No additional action taken. .     Eyes:  EOMI, PERRL, sclerae anicteric, conjunctivae pink.    HENT:  Atraumatic. Poor dentition-Teeth in process of being extracted   Neck:  Supple, thyroid midline.  Cardiovascular:  HR 106-no m/r/g. No LE edema x2.  Respiratory:  CTAB. Unlabored.     Gastrointestinal:  Soft and nontender, no masses or HSM.    Skin:  No rashes or subcutaneous nodules.    Musculoskeletal:  No bony pain or tenderness.  Nl gait.      Psychiatric:  Affect appropriate.  Judgment and insight nl.  Neurologic:  Alert and oriented x3.  Grossly non-focal.          Labs:   I personally reviewed the following labs.    Lab Results   Component Value Date    WBC  6.5 02/18/2019    HGB 9.4 (L) 02/18/2019    HCT 28.3 (L) 02/18/2019    PLT 272 02/18/2019       Lab Results   Component Value Date    NA 138 02/18/2019    K 4.2 02/18/2019    CL 106 02/18/2019    CO2 23.1 02/18/2019    BUN 21 (H) 02/18/2019    CREATININE 1.37 (H) 02/18/2019    GLU 100 (H) 02/18/2019    CALCIUM 9.4 02/18/2019    MG 1.9 02/18/2019    PHOS 2.0 (L) 04/07/2018       Lab Results   Component Value Date    BILITOT 1.1 (H) 02/18/2019    BILIDIR 0.80 (H) 12/21/2018    PROT 8.0 02/18/2019    ALBUMIN 3.6 02/18/2019    ALT 55 02/18/2019    AST 35 02/18/2019    ALKPHOS 171 (H) 02/18/2019       Lab Results   Component Value Date    LABPROT 13.0 07/11/2014    INR 0.87 (L) 04/06/2018    APTT 26 07/11/2014         Results for orders placed or performed in visit on 02/18/19   Comprehensive Metabolic Panel   Result Value Ref Range    Sodium 138 135 - 145 mmol/L    Potassium 4.2 3.5 - 5.0 mmol/L    Chloride 106 98 - 107 mmol/L    Anion Gap 9 3 - 11 mmol/L    CO2 23.1 21.0 - 32.0 mmol/L    BUN 21 (H) 10 - 20 mg/dL    Creatinine 2.44 (H) 0.60 - 1.10 mg/dL    BUN/Creatinine Ratio 15     EGFR CKD-EPI Non-African American, Female 41 mL/min/1.65m2    EGFR CKD-EPI African American, Female 47 mL/min/1.33m2    Glucose 100 (H) 65 - 99 mg/dL    Calcium 9.4 8.5 - 01.0 mg/dL    Albumin 3.6 3.5 - 5.0 g/dL    Total Protein 8.0 6.0 - 8.0 g/dL    Total Bilirubin 1.1 (H) 0.2 - 1.0 mg/dL    AST 35 15 - 40 U/L    ALT 55 12 - 78 U/L    Alkaline Phosphatase 171 (H) 50 - 136 U/L   Magnesium Level   Result Value Ref Range    Magnesium 1.9 1.5 - 2.1 mg/dL   CBC w/ Differential   Result Value Ref Range    WBC 6.5 3.5 - 10.5 10*9/L    RBC 3.16 (L) 3.90 - 5.03 10*12/L    HGB 9.4 (L) 12.0 - 15.5 g/dL    HCT 27.2 (L) 53.6 - 44.0 %    MCV 89.4 82.0 - 98.0 fL    MCH 29.8 26.0 - 34.0 pg    MCHC 33.3 30.0 - 36.0 g/dL    RDW 64.4 (H) 03.4 - 15.0 %    MPV 6.5 (L) 7.0 - 10.0 fL    Platelet 272 150 - 450 10*9/L    Neutrophils % 64.6 %    Lymphocytes % 24.1 %    Monocytes % 10.0 %    Eosinophils % 0.9 %    Basophils % 0.4 %    Absolute Neutrophils 4.1 1.7 - 7.7 10*9/L    Absolute Lymphocytes 1.6 0.7 - 4.0 10*9/L    Absolute Monocytes 0.7 0.1 - 1.0 10*9/L    Absolute Eosinophils 0.1 0.0 - 0.7 10*9/L    Absolute Basophils 0.0 0.0 - 0.1 10*9/L  Anisocytosis Slight (A) Not Present         The patient reports that all of her questions were answered to her satisfaction today.  She was encouraged to contact us for any questions that may arise prior to her next visit.    Thank you very much for allowing me to participate in her care.

## 2019-03-04 MED ORDER — TASIGNA 150 MG CAPSULE
ORAL_CAPSULE | 0 refills | 0 days | Status: CP
Start: 2019-03-04 — End: ?

## 2019-03-09 MED ORDER — NILOTINIB 200 MG CAPSULE
ORAL_CAPSULE | Freq: Every day | ORAL | 3 refills | 30 days | Status: CP
Start: 2019-03-09 — End: 2019-04-08

## 2019-04-21 DIAGNOSIS — C9210 Chronic myeloid leukemia, BCR/ABL-positive, not having achieved remission: Secondary | ICD-10-CM

## 2019-04-21 MED ORDER — GABAPENTIN 300 MG CAPSULE
ORAL_CAPSULE | Freq: Two times a day (BID) | ORAL | 3 refills | 90 days | Status: CP
Start: 2019-04-21 — End: 2020-04-20

## 2019-04-21 NOTE — Unmapped (Signed)
Patient called and left a VM to request a refill for Gabapentin (patient taking 2 BID). Refill sent to pharmacy on file per policy/VO from Dr. Sanda Klein.     Last Refill: 02/08/19  Last Appointment: 02/18/19 (scheduled 05/25/19)

## 2019-05-24 DIAGNOSIS — C9210 Chronic myeloid leukemia, BCR/ABL-positive, not having achieved remission: Principal | ICD-10-CM

## 2019-05-25 ENCOUNTER — Ambulatory Visit: Admit: 2019-05-25 | Discharge: 2019-05-25 | Payer: MEDICARE

## 2019-05-25 ENCOUNTER — Ambulatory Visit: Admit: 2019-05-25 | Discharge: 2019-05-25 | Payer: MEDICARE | Attending: Adult Health | Primary: Adult Health

## 2019-05-25 DIAGNOSIS — C9210 Chronic myeloid leukemia, BCR/ABL-positive, not having achieved remission: Principal | ICD-10-CM

## 2019-05-25 LAB — COMPREHENSIVE METABOLIC PANEL
ALBUMIN: 3.6 g/dL (ref 3.5–5.0)
ALKALINE PHOSPHATASE: 104 U/L (ref 50–136)
ALT (SGPT): 47 U/L (ref 12–78)
ANION GAP: 11 mmol/L (ref 3–11)
AST (SGOT): 27 U/L (ref 15–40)
BLOOD UREA NITROGEN: 21 mg/dL — ABNORMAL HIGH (ref 10–20)
CALCIUM: 9.4 mg/dL (ref 8.5–10.1)
CO2: 21.7 mmol/L (ref 21.0–32.0)
CREATININE: 1.62 mg/dL — ABNORMAL HIGH (ref 0.60–1.10)
EGFR CKD-EPI AA FEMALE: 38 mL/min/{1.73_m2}
EGFR CKD-EPI NON-AA FEMALE: 33 mL/min/{1.73_m2}
GLUCOSE RANDOM: 77 mg/dL (ref 70–179)
POTASSIUM: 4 mmol/L (ref 3.5–5.0)
PROTEIN TOTAL: 7.8 g/dL (ref 6.0–8.0)
SODIUM: 141 mmol/L (ref 135–145)

## 2019-05-25 LAB — CBC W/ AUTO DIFF
BASOPHILS ABSOLUTE COUNT: 0 10*9/L (ref 0.0–0.1)
EOSINOPHILS ABSOLUTE COUNT: 0.1 10*9/L (ref 0.0–0.7)
EOSINOPHILS RELATIVE PERCENT: 1.4 %
HEMATOCRIT: 30.3 % — ABNORMAL LOW (ref 35.0–44.0)
HEMOGLOBIN: 10.1 g/dL — ABNORMAL LOW (ref 12.0–15.5)
LYMPHOCYTES ABSOLUTE COUNT: 1.8 10*9/L (ref 0.7–4.0)
LYMPHOCYTES RELATIVE PERCENT: 33.6 %
MEAN CORPUSCULAR HEMOGLOBIN CONC: 33.4 g/dL (ref 30.0–36.0)
MEAN CORPUSCULAR HEMOGLOBIN: 29.7 pg (ref 26.0–34.0)
MEAN CORPUSCULAR VOLUME: 89 fL (ref 82.0–98.0)
MEAN PLATELET VOLUME: 5.9 fL — ABNORMAL LOW (ref 7.0–10.0)
MONOCYTES ABSOLUTE COUNT: 0.6 10*9/L (ref 0.1–1.0)
MONOCYTES RELATIVE PERCENT: 11.5 %
NEUTROPHILS ABSOLUTE COUNT: 2.9 10*9/L (ref 1.7–7.7)
NEUTROPHILS RELATIVE PERCENT: 53 %
PLATELET COUNT: 256 10*9/L (ref 150–450)
RED BLOOD CELL COUNT: 3.41 10*12/L — ABNORMAL LOW (ref 3.90–5.03)
RED CELL DISTRIBUTION WIDTH: 13.4 % (ref 12.0–15.0)
WBC ADJUSTED: 5.4 10*9/L (ref 3.5–10.5)

## 2019-05-25 LAB — HEMATOCRIT: Hematocrit:VFr:Pt:Bld:Qn:: 30.3 — ABNORMAL LOW

## 2019-05-25 LAB — EGFR CKD-EPI NON-AA FEMALE: Lab: 33

## 2019-05-25 NOTE — Unmapped (Signed)
[facility]  Note      Patient Name:  Katherine Hayes  Date of Birth:  September 02, 1955  Date of Encounter:  05/25/2019    Referring Provider:  Katheran James, *  PCP:  Katheran James, MD      Reason for Visit:   1. 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    Given increasing anorexia and weight loss, which started following initiation of Tasgina, dose was reduced from 300mg  BID to 400mg  daily in July 2020.    Plan:  1. CML:     - Tasigna  400mg  daily-tolerating this dose better.    - bcr-abl 4 --> 0.292 since initiation of Tasigna -pending.  2. H/O Hemolytic anemia:    - very extensive work up detailed below. Has been seen at [facility]. Hemolysis was felt to be minimal  3. Diarrhea: C diff negative. Infectious work up negative.  Thought to be related to Gleevec.   - diarrhea resolved following d/c gleevec   - weight was increased as was albumin   - gained weight back    RTC - 2 months    Note- Received message from lab that they did not have enough tubes to run the BCR/ABL test as requested. Pt is aware and will go to lab on 10/26 to have labs re-drawn.     Treatment history:  1. Gleevec- discontinued 04/2018 d/t colitis  2. Tasigna 10/21/18- current    Hematology History: This is a 63 year old female who was initially diagnosed with CML in 2006.  She reports being on Gleevec for a number of years, but due to financial and insurance issues, she was off therapy from 2009-2013. She was seen by hematologist and initially placed on Hydrea for a rising white count. Once the patient was able to obtain Gleevec, she was started back on it. Over the last 6-12 months, she reports increased fatigue and malaise.  She has not had her blood counts checked since May 2015. Due to financial issues, she had been off gleevec during this time. She started back on gleevec ?around 11/2013. When she was initially seen in [place], I was concerned for primary bone marrow failure (ie CML with accelerated phase or blast crisis), but a bone marrow biopsy on 06/27/14 showed normal hematopoesis, normal cytogenetics and flow. Her bcr-abl was 9% by PCR. On follow up, labs in mid-December showed hemolysis with an undetectable haptoglobin. Interesting, her LDH and bili were normal. G6PD was normal. Hemoglobin electrophoresis was normal expect for a small amount (0.9%) HgbF. More outside records \\were  obtain which indicated that she had had an undetectable haptoglobin as early as 07/2013 suggesting a chronic hemolytic process. Blood smear was again reviewed which showed 3-4 schistocytes per high-power field, c/w a microangiopathic hemolytic anemia.  An ADAMTS13 was sent and was normal. Fibrinogen was normal making DIC unlikely. PNH was sent but the tube was hemolyzed and could not be run. Cold agglutinins negative. She was hospitalized at [facility] for a GI bleed and diarrhea in 12/2017. She required 2u pRBC. She did not undergo scopes as the bleeding spontaneously ceased. She was set up for EGD/Cscope with Dr. Marin Olp as an outpatient. EGD 03/09/18 showed healing esophagitis and a normal stomach and duodenum. Bx showed reactive gastropathy with neg H pylori. Small bowel bx were negative for features of celiac disease. Colonoscopy findings 03/09/18 described as diffuse mild  mucosal changes throughout the entire examine colon concerning for edema/colitis. This was thought to possible be related to her Gleevec.    She was admitted 04/01/18 due to worsening diarrhea and malabsorption. CT showed ileal wall thickening extending to the level of the terminal ileum. No colonic involvement was noted. Flex sigmoidoscopy 8/30 showed localized mild inflammation in the proximal sigmoid colon. Biopsies were taken which showed normal mucosa. Her gleevec has been held since that hospitalization.     Her symptoms have significantly improved since stopping the gleevec    Interim history:  Pt presents in follow up. She is doing well.Intermittent sinus type headaches improved. BP normal. Weight up. She denies any visual changes, chest pain, SOB or DOE. No s/s of bleeding. She feels quite well today. She looks very robust!    PCR trend:  07/2013: BCR/ABL- PCR 91%  12/2013: BCR/ABL PCR???5.42%   06/2014: BCR/ABL PCR 0.9%   09/20/14: BCR/ABL PCR 15% (had been off gleevec due to count abnormalities)--> gleevec restarted at 400mg   11/22/14- gleevec held for tcp <50  02/22/15: BCR-ABL 4%   9/16- gleevec restarted at 300mg   08/01/15- BCR-ABL 0.161%   03/25/16- BCR-ABL- 0.028 IS %  05/15/16-  BCR-ABL- 0.033 IS %  09/16/16- BCR-ABL- .037%  02/10/2017: BCR-ABL- .021%  05/04/17- BCR-ABL- .027%  07/09/17: BCR-ABL- .025%  01/28/18- BCR-ABL- .023%   07/2018: BCR-ABL- 0.238  05/25/19: BCR-ABL: PENDING      Past Medical History:  1.  CML.  2.  Gout.  3.  Hypertension. 4.  Acid reflux.  5.  Neuropathy    Past Surgical History:      She has a past surgical history that includes Radiation; Chemotherapy; Breast lumpectomy (Left, 1991); Dental surgery; and pr sigmoidoscopy,biopsy (N/A, 04/02/2018).      Allergies:     She is allergic to lisinopril.        Medications:     She has a current medication list which includes the following prescription(s): carvedilol, ferrous sulfate, gabapentin, ibuprofen, tasigna, and folic acid, and the following Facility-Administered Medications: heparin, porcine (pf) and heparin, porcine (pf).      Family History:    No family history of malignancy or blood disorders    Social History:     Social History     Socioeconomic History   ??? Marital status: Single     Spouse name: None   ??? Number of children: None   ??? Years of education: None   ??? Highest education level: None   Occupational History   ??? None   Social Needs   ??? Financial resource strain: None   ??? Food insecurity     Worry: None     Inability: None   ??? Transportation needs     Medical: None     Non-medical: None   Tobacco Use   ??? Smoking status: Never Smoker   ??? Smokeless tobacco: Never Used   Substance and Sexual Activity   ??? Alcohol use: Yes     Alcohol/week: 3.0 standard drinks     Types: 3 Glasses of wine per week   ??? Drug use: No   ??? Sexual activity: None   Lifestyle   ??? Physical activity     Days per week: None     Minutes per session: None   ??? Stress: None   Relationships   ??? Social Wellsite geologist on phone: None     Gets together: None     Attends religious  service: None     Active member of club or organization: None     Attends meetings of clubs or organizations: None     Relationship status: None   Other Topics Concern   ??? None   Social History Narrative   ??? None         Review of Systems:   CONSTITUTIONAL: Feels well. NAD HEENT: Eyes: No visual loss, blurred vision, double vision or yellow sclerae. Ears, Nose, Throat: No hearing loss, sneezing, congestion, runny nose or sore throat.   SKIN: No rash or itching.   CARDIOVASCULAR: No chest pain, chest pressure or chest discomfort. No palpitations or edema.   RESPIRATORY: No shortness of breath, cough or sputum.   GASTROINTESTINAL: Diarrhea. 8x per day  NEUROLOGICAL: no headache, no syncope, paralysis, ataxia,   MUSCULOSKELETAL: No muscle or bone pain; no joint pain or stiffness.   HEMATOLOGIC: +chronic  anemia, no abnormal bleeding or bruising.   LYMPHATICS: No enlarged nodes. No history of splenectomy.   PSYCHIATRIC: No history of depression or anxiety.   ENDOCRINOLOGIC: No reports of sweating, cold or heat intolerance. No polyuria or polydipsia.   ALLERGIES: No history of asthma, hives, eczema or rhinitis.  Neuro: Worsening PN in fingers and toes    Physical Exam:    Vitals: BP 149/86  - Pulse 74  - Temp 37.1 ??C (98.8 ??F) (Oral)  - Resp 18  - Ht 167.6 cm (5' 5.98)  - Wt 68.7 kg (151 lb 8 oz)  - SpO2 99%  - BMI 24.46 kg/m??      General:  Pleasant, no acute distress.    ECOG 0  Pain  Pain Evaluation:                                 0-10  Pain Score (0 - 1):                             5  Pain Score (Wong-Baker Faces):        Pain Location:                                    Back, Head   Pain Frequency:                                Intermittent       Distress screening was reviewed and discussed during consult, and No additional action taken. .     Eyes:   sclerae anicteric, conjunctivae pink.    HENT:  Atraumatic.   Neck:  Supple  Cardiovascular:  HR 74-no m/r/g. No LE edema x2.  Respiratory:  CTAB. Unlabored.     Gastrointestinal:  NT/ND   Skin:  No rashes or subcutaneous nodules.    Musculoskeletal:  No bony pain or tenderness.  Nl gait.      Psychiatric:  Affect appropriate.  Judgment and insight nl.  Neurologic:  Alert and oriented x3.  Grossly non-focal.          Labs: I personally reviewed the following labs.    Lab Results   Component Value Date    WBC 5.4 05/25/2019    HGB 10.1 (L) 05/25/2019    HCT 30.3 (L) 05/25/2019  PLT 256 05/25/2019       Lab Results   Component Value Date    NA 141 05/25/2019    K 4.0 05/25/2019    CL 108 (H) 05/25/2019    CO2 21.7 05/25/2019    BUN 21 (H) 05/25/2019    CREATININE 1.62 (H) 05/25/2019    GLU 77 05/25/2019    CALCIUM 9.4 05/25/2019    MG 1.9 02/18/2019    PHOS 2.0 (L) 04/07/2018       Lab Results   Component Value Date    BILITOT 0.7 05/25/2019    BILIDIR 0.80 (H) 12/21/2018    PROT 7.8 05/25/2019    ALBUMIN 3.6 05/25/2019    ALT 47 05/25/2019    AST 27 05/25/2019    ALKPHOS 104 05/25/2019       Lab Results   Component Value Date    LABPROT 13.0 07/11/2014    INR 0.87 (L) 04/06/2018    APTT 26 07/11/2014         Results for orders placed or performed in visit on 05/25/19   Comprehensive Metabolic Panel   Result Value Ref Range    Sodium 141 135 - 145 mmol/L    Potassium 4.0 3.5 - 5.0 mmol/L    Chloride 108 (H) 98 - 107 mmol/L    Anion Gap 11 3 - 11 mmol/L    CO2 21.7 21.0 - 32.0 mmol/L    BUN 21 (H) 10 - 20 mg/dL    Creatinine 1.61 (H) 0.60 - 1.10 mg/dL    BUN/Creatinine Ratio 13     EGFR CKD-EPI Non-African American, Female 33 mL/min/1.75m2    EGFR CKD-EPI African American, Female 38 mL/min/1.58m2    Glucose 77 70 - 179 mg/dL    Calcium 9.4 8.5 - 09.6 mg/dL    Albumin 3.6 3.5 - 5.0 g/dL    Total Protein 7.8 6.0 - 8.0 g/dL    Total Bilirubin 0.7 0.2 - 1.0 mg/dL    AST 27 15 - 40 U/L    ALT 47 12 - 78 U/L    Alkaline Phosphatase 104 50 - 136 U/L   CBC w/ Differential   Result Value Ref Range    WBC 5.4 3.5 - 10.5 10*9/L    RBC 3.41 (L) 3.90 - 5.03 10*12/L    HGB 10.1 (L) 12.0 - 15.5 g/dL    HCT 04.5 (L) 40.9 - 44.0 %    MCV 89.0 82.0 - 98.0 fL    MCH 29.7 26.0 - 34.0 pg    MCHC 33.4 30.0 - 36.0 g/dL    RDW 81.1 91.4 - 78.2 %    MPV 5.9 (L) 7.0 - 10.0 fL    Platelet 256 150 - 450 10*9/L    Neutrophils % 53.0 %    Lymphocytes % 33.6 % Monocytes % 11.5 %    Eosinophils % 1.4 %    Basophils % 0.5 %    Absolute Neutrophils 2.9 1.7 - 7.7 10*9/L    Absolute Lymphocytes 1.8 0.7 - 4.0 10*9/L    Absolute Monocytes 0.6 0.1 - 1.0 10*9/L    Absolute Eosinophils 0.1 0.0 - 0.7 10*9/L    Absolute Basophils 0.0 0.0 - 0.1 10*9/L     Horris Latino, NP

## 2019-05-27 DIAGNOSIS — C9210 Chronic myeloid leukemia, BCR/ABL-positive, not having achieved remission: Principal | ICD-10-CM

## 2019-05-27 NOTE — Unmapped (Addendum)
-----   Message from Horris Latino, ANP sent at 05/26/2019  8:16 AM EDT -----  Regarding: FW: BCR/ABL  Can you let Jasmine December know that the lab needs more blood to run the BCR/ ABL according to Dena???s message below and I apologize for the inconvenience. If she could come back sometime this week or even Monday that would be great! Apparently they need 2 lavender tubes and only one was drawn.     Thank you !  [facility] patient and left a VM advising patient of the above. Advised she can either go to the outpatient lab at [facility] or she can can come back to our office. Advised patient to call with any questions or concerns.

## 2019-06-06 DIAGNOSIS — C9210 Chronic myeloid leukemia, BCR/ABL-positive, not having achieved remission: Principal | ICD-10-CM

## 2019-07-06 NOTE — Unmapped (Signed)
Received fax from [facility] indicating PA required for Tasigna 200mg  capsules.  Completed PA and outcome indicated medication is available without authorization. Confirmed with [facility] PA department that PA is not required for this dose/medication for this patient.

## 2019-07-25 NOTE — Unmapped (Signed)
Received  from [facility] Department that the [facility] network is resending patient's grant for Tasigna as there are incorrect cancer codes. [employer] can be contacted at [phone] and [facility] can be contacted at [phone].     Message to Ronette Deter, oral chemo nurse to look into further.

## 2019-07-26 ENCOUNTER — Ambulatory Visit
Admit: 2019-07-26 | Discharge: 2019-07-26 | Payer: MEDICARE | Attending: Student in an Organized Health Care Education/Training Program | Primary: Student in an Organized Health Care Education/Training Program

## 2019-07-26 ENCOUNTER — Ambulatory Visit: Admit: 2019-07-26 | Discharge: 2019-07-26 | Payer: MEDICARE

## 2019-07-26 DIAGNOSIS — C9210 Chronic myeloid leukemia, BCR/ABL-positive, not having achieved remission: Principal | ICD-10-CM

## 2019-07-26 LAB — CBC W/ AUTO DIFF
BASOPHILS ABSOLUTE COUNT: 0 10*9/L (ref 0.0–0.1)
BASOPHILS RELATIVE PERCENT: 0.3 %
EOSINOPHILS ABSOLUTE COUNT: 0.1 10*9/L (ref 0.0–0.7)
HEMATOCRIT: 30.6 % — ABNORMAL LOW (ref 35.0–44.0)
HEMOGLOBIN: 10.2 g/dL — ABNORMAL LOW (ref 12.0–15.5)
LYMPHOCYTES ABSOLUTE COUNT: 1.9 10*9/L (ref 0.7–4.0)
LYMPHOCYTES RELATIVE PERCENT: 33.3 %
MEAN CORPUSCULAR HEMOGLOBIN CONC: 33.2 g/dL (ref 30.0–36.0)
MEAN CORPUSCULAR HEMOGLOBIN: 29.5 pg (ref 26.0–34.0)
MEAN CORPUSCULAR VOLUME: 89 fL (ref 82.0–98.0)
MEAN PLATELET VOLUME: 6.5 fL — ABNORMAL LOW (ref 7.0–10.0)
MONOCYTES RELATIVE PERCENT: 9.1 %
NEUTROPHILS ABSOLUTE COUNT: 3.2 10*9/L (ref 1.7–7.7)
PLATELET COUNT: 175 10*9/L (ref 150–450)
RED BLOOD CELL COUNT: 3.44 10*12/L — ABNORMAL LOW (ref 3.90–5.03)
RED CELL DISTRIBUTION WIDTH: 15.5 % — ABNORMAL HIGH (ref 12.0–15.0)
WBC ADJUSTED: 5.7 10*9/L (ref 3.5–10.5)

## 2019-07-26 LAB — COMPREHENSIVE METABOLIC PANEL
ALBUMIN: 3.6 g/dL (ref 3.5–5.0)
ALKALINE PHOSPHATASE: 102 U/L (ref 50–136)
ALT (SGPT): 26 U/L (ref 12–78)
AST (SGOT): 19 U/L (ref 15–40)
BILIRUBIN TOTAL: 0.7 mg/dL (ref 0.2–1.0)
BLOOD UREA NITROGEN: 26 mg/dL — ABNORMAL HIGH (ref 10–20)
BUN / CREAT RATIO: 15
CALCIUM: 9.4 mg/dL (ref 8.5–10.1)
CHLORIDE: 106 mmol/L (ref 98–107)
CO2: 22 mmol/L (ref 21.0–32.0)
CREATININE: 1.79 mg/dL — ABNORMAL HIGH (ref 0.60–1.10)
EGFR CKD-EPI AA FEMALE: 34 mL/min/{1.73_m2}
GLUCOSE RANDOM: 95 mg/dL (ref 70–179)
POTASSIUM: 4.4 mmol/L (ref 3.5–5.0)
PROTEIN TOTAL: 7.6 g/dL (ref 6.0–8.0)
SODIUM: 140 mmol/L (ref 135–145)

## 2019-07-26 LAB — EGFR CKD-EPI AA FEMALE: Lab: 34

## 2019-07-26 LAB — EOSINOPHILS ABSOLUTE COUNT: Eosinophils:NCnc:Pt:Bld:Qn:Automated count: 0.1

## 2019-07-26 MED ORDER — CARVEDILOL 25 MG TABLET
ORAL_TABLET | Freq: Two times a day (BID) | ORAL | 11 refills | 30 days | Status: CP
Start: 2019-07-26 — End: 2020-07-25

## 2019-07-26 MED ADMIN — heparin preservative-free injection 10 units/mL syringe (HEPARIN LOCK FLUSH): 50 [IU] | INTRAVENOUS | @ 19:00:00 | Stop: 2019-07-26

## 2019-07-27 DIAGNOSIS — C9210 Chronic myeloid leukemia, BCR/ABL-positive, not having achieved remission: Principal | ICD-10-CM

## 2019-07-27 NOTE — Unmapped (Signed)
SW was alerted that pt reported an elevated distress screening of 7 during her provider visit on 12/22.  Pt verbalized to MA that distress is related to feelings of Depression.  SW reviewed pt's medical record and contacted pt by phone.  SW left vm requesting returned call.  SW will attempt to call again.

## 2019-07-27 NOTE — Unmapped (Signed)
Katherine Hayes  Note      Patient Name:  Katherine Hayes  Date of Birth:  [birth year]  Date of Encounter:  07/26/2019    Referring Provider:  Katheran Hayes, *  PCP:  Katherine James, MD      Reason for Visit:   1. 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 She is on a dose reduction of Tasigna 400mg  dailyl d/t anorexia and weight loss.    Plan:  1. CML:     - Tasigna  400mg  daily-tolerating this dose better.    - bcr-abl 4 --> 0.292 since initiation of Tasigna -bcr-abl today pending.  2. H/O Hemolytic anemia:    - very extensive work up detailed below. Has been seen at [facility]. Hemolysis was felt to be minimal  3. Diarrhea: C diff negative. Infectious work up negative.  Thought to be related to Gleevec.   - diarrhea resolved following d/c gleevec   - weight was increased as was albumin   - gained weight back  4. Hypertension/HAs- increase coreg to 25mg  BID. Has been referred to primary care to establish care.    RTC - 3 months        Treatment history:  1. Gleevec- discontinued 04/2018 d/t colitis  2. Tasigna 10/21/18- current    Hematology History:    This is a 63 year old female who was initially diagnosed with CML in 2006.  She reports being on Gleevec for a number of years, but due to financial and insurance issues, she was off therapy from 2009-2013. She was seen by hematologist and initially placed on Hydrea for a rising white count. Once the patient was able to obtain Gleevec, she was started back on it. Over the last 6-12 months, she reports increased fatigue and malaise.  She has not had her blood counts checked since May 2015. Due to financial issues, she had been off gleevec during this time. She started back on gleevec ?around 11/2013. When she was initially seen in [place], I was concerned for primary bone marrow failure (ie CML with accelerated phase or blast crisis), but a bone marrow biopsy on 06/27/14 showed normal hematopoesis, normal cytogenetics and flow. Her bcr-abl was 9% by PCR. On follow up, labs in mid-December showed hemolysis with an undetectable haptoglobin. Interesting, her LDH and bili were normal. G6PD was normal. Hemoglobin electrophoresis was normal expect for a small amount (0.9%) HgbF. More outside records \\were  obtain which indicated that she had had an undetectable haptoglobin as early as 07/2013 suggesting a chronic hemolytic process. Blood smear was again reviewed which showed 3-4 schistocytes per high-power field, c/w a microangiopathic hemolytic anemia.  An ADAMTS13 was sent and was normal. Fibrinogen was normal making DIC unlikely. PNH was sent but the tube was hemolyzed and could not be run. Cold agglutinins negative.      She was hospitalized at [facility] for a GI bleed and diarrhea in 12/2017. She required 2u pRBC. She did not undergo scopes as the bleeding spontaneously ceased. She was set up for EGD/Cscope with Dr. Marin Hayes as an outpatient. EGD 03/09/18 showed healing esophagitis and a normal stomach and duodenum. Bx showed reactive gastropathy with neg H pylori. Small bowel bx were negative for features of celiac disease. Colonoscopy findings 03/09/18 described as diffuse mild mucosal changes throughout the entire examine colon concerning for edema/colitis. This was thought to possible be related to  her Gleevec.    She was admitted 04/01/18 due to worsening diarrhea and malabsorption. CT showed ileal wall thickening extending to the level of the terminal ileum. No colonic involvement was noted. Flex sigmoidoscopy 8/30 showed localized mild inflammation in the proximal sigmoid colon. Biopsies were taken which showed normal mucosa. Her gleevec has been held since that hospitalization.     Her symptoms have significantly improved since stopping the gleevec    Interim history:  Pt presents in follow up. She is doing well. Headaches noted. BP has been elevated at home. Weight up. She denies any visual changes, chest pain, SOB or DOE. No s/s of bleeding.     PCR trend:  07/2013: BCR/ABL- PCR 91%  12/2013: BCR/ABL PCR???5.42%   06/2014: BCR/ABL PCR 0.9%   09/20/14: BCR/ABL PCR 15% (had been off gleevec due to count abnormalities)--> gleevec restarted at 400mg   11/22/14- gleevec held for tcp <50  02/22/15: BCR-ABL 4%   9/16- gleevec restarted at 300mg   08/01/15- BCR-ABL 0.161%   03/25/16- BCR-ABL- 0.028 IS %  05/15/16-  BCR-ABL- 0.033 IS %  09/16/16- BCR-ABL- .037%  02/10/2017: BCR-ABL- .021%  05/04/17- BCR-ABL- .027%  07/09/17: BCR-ABL- .025%  01/28/18- BCR-ABL- .023%   07/2018: BCR-ABL- 0.238  05/25/19: BCR-ABL: PENDING      Past Medical History:  1.  CML.  2.  Gout.  3.  Hypertension.  4.  Acid reflux.  5.  Neuropathy    Past Surgical History:      She has a past surgical history that includes Radiation; Chemotherapy; Breast lumpectomy (Left, 1991); Dental surgery; and pr sigmoidoscopy,biopsy (N/A, 04/02/2018).      Allergies:     She is allergic to lisinopril.        Medications:     She has a current medication list which includes the following prescription(s): ferrous sulfate, folic acid, gabapentin, ibuprofen, tasigna, and carvedilol, and the following Facility-Administered Medications: heparin, porcine (pf) and heparin, porcine (pf).      Family History:    No family history of malignancy or blood disorders    Social History:     Social History     Socioeconomic History   ??? Marital status: Single     Spouse name: None   ??? Number of children: None   ??? Years of education: None   ??? Highest education level: None   Occupational History     Employer: NOT EMPLOYED   Social Needs   ??? Financial resource strain: None   ??? Food insecurity     Worry: None     Inability: None   ??? Transportation needs     Medical: None     Non-medical: None   Tobacco Use   ??? Smoking status: Never Smoker   ??? Smokeless tobacco: Never Used   Substance and Sexual Activity   ??? Alcohol use: Yes     Alcohol/week: 3.0 standard drinks     Types: 3 Glasses of wine per week   ??? Drug use: No   ??? Sexual activity: None   Lifestyle   ??? Physical activity     Days per week: None     Minutes per session: None   ??? Stress: None   Relationships   ??? Social Wellsite geologist on phone: None     Gets together: None     Attends religious service: None     Active member of club or organization: None     Attends meetings of  clubs or organizations: None     Relationship status: None   Other Topics Concern   ??? None   Social History Narrative   ??? None         Review of Systems:   CONSTITUTIONAL: Feels well. NAD  HEENT: Eyes: No visual loss, blurred vision, double vision or yellow sclerae. Ears, Nose, Throat: No hearing loss, sneezing, congestion, runny nose or sore throat.   SKIN: No rash or itching.   CARDIOVASCULAR: No chest pain, chest pressure or chest discomfort. No palpitations or edema.   RESPIRATORY: No shortness of breath, cough or sputum.   GASTROINTESTINAL: Diarrhea. 8x per day  NEUROLOGICAL: no headache, no syncope, paralysis, ataxia,   MUSCULOSKELETAL: No muscle or bone pain; no joint pain or stiffness.   HEMATOLOGIC: +chronic  anemia, no abnormal bleeding or bruising.   LYMPHATICS: No enlarged nodes. No history of splenectomy.   PSYCHIATRIC: No history of depression or anxiety.   ENDOCRINOLOGIC: No reports of sweating, cold or heat intolerance. No polyuria or polydipsia.   ALLERGIES: No history of asthma, hives, eczema or rhinitis.  Neuro: Worsening PN in fingers and toes    Physical Exam:    Vitals: BP 170/81  - Pulse 72  - Temp 36.7 ??C (98 ??F) (Temporal)  - Resp 16  - Ht 167.6 cm (5' 5.98)  - Wt 71.1 kg (156 lb 12.8 oz)  - SpO2 98%  - BMI 25.32 kg/m??    General:  Pleasant, no acute distress.    ECOG 1  Eyes:  EOMI, PERRL, sclerae anicteric, conjunctivae pink.    HENT:  MMM. No thrush.   Respiratory:  Unlabored. No audible wheezing  Skin:  No rashes present.  MSK: no joint abnormalities  Psychiatric:  Affect appropriate.  Judgment and insight nl.  Neurologic: Alert and oriented x3.  Grossly non-focal.            Labs:   I personally reviewed the following labs.    Lab Results   Component Value Date    WBC 5.7 07/26/2019    HGB 10.2 (L) 07/26/2019    HCT 30.6 (L) 07/26/2019    PLT 175 07/26/2019       Lab Results   Component Value Date    NA 140 07/26/2019    K 4.4 07/26/2019    CL 106 07/26/2019    CO2 22.0 07/26/2019    BUN 26 (H) 07/26/2019    CREATININE 1.79 (H) 07/26/2019    GLU 95 07/26/2019    CALCIUM 9.4 07/26/2019    MG 1.9 02/18/2019    PHOS 2.0 (L) 04/07/2018       Lab Results   Component Value Date    BILITOT 0.7 07/26/2019    BILIDIR 0.80 (H) 12/21/2018    PROT 7.6 07/26/2019    ALBUMIN 3.6 07/26/2019    ALT 26 07/26/2019    AST 19 07/26/2019    ALKPHOS 102 07/26/2019       Lab Results   Component Value Date    LABPROT 13.0 07/11/2014    INR 0.87 (L) 04/06/2018    APTT 26 07/11/2014         Results for orders placed or performed in visit on 07/26/19   Comprehensive Metabolic Panel   Result Value Ref Range    Sodium 140 135 - 145 mmol/L    Potassium 4.4 3.5 - 5.0 mmol/L    Chloride 106 98 - 107 mmol/L    Anion Gap 12 (H)  3 - 11 mmol/L    CO2 22.0 21.0 - 32.0 mmol/L    BUN 26 (H) 10 - 20 mg/dL    Creatinine 1.61 (H) 0.60 - 1.10 mg/dL    BUN/Creatinine Ratio 15     EGFR CKD-EPI Non-African American, Female 29 mL/min/1.52m2    EGFR CKD-EPI African American, Female 34 mL/min/1.12m2    Glucose 95 70 - 179 mg/dL    Calcium 9.4 8.5 - 09.6 mg/dL    Albumin 3.6 3.5 - 5.0 g/dL    Total Protein 7.6 6.0 - 8.0 g/dL    Total Bilirubin 0.7 0.2 - 1.0 mg/dL    AST 19 15 - 40 U/L    ALT 26 12 - 78 U/L    Alkaline Phosphatase 102 50 - 136 U/L   CBC w/ Differential   Result Value Ref Range    WBC 5.7 3.5 - 10.5 10*9/L    RBC 3.44 (L) 3.90 - 5.03 10*12/L    HGB 10.2 (L) 12.0 - 15.5 g/dL    HCT 04.5 (L) 40.9 - 44.0 %    MCV 89.0 82.0 - 98.0 fL    MCH 29.5 26.0 - 34.0 pg    MCHC 33.2 30.0 - 36.0 g/dL    RDW 81.1 (H) 91.4 - 15.0 %    MPV 6.5 (L) 7.0 - 10.0 fL    Platelet 175 150 - 450 10*9/L    Neutrophils % 55.3 %    Lymphocytes % 33.3 %    Monocytes % 9.1 %    Eosinophils % 2.0 %    Basophils % 0.3 %    Absolute Neutrophils 3.2 1.7 - 7.7 10*9/L    Absolute Lymphocytes 1.9 0.7 - 4.0 10*9/L    Absolute Monocytes 0.5 0.1 - 1.0 10*9/L    Absolute Eosinophils 0.1 0.0 - 0.7 10*9/L    Absolute Basophils 0.0 0.0 - 0.1 10*9/L

## 2019-08-01 NOTE — Unmapped (Signed)
SW made second attempt to contact pt to f/u on elevated distress screening related to feelings of Depression.  Pt picked up right away and was open to SW dialogue. Pt reports feeling down, discouraged, and feeling depressed and attributed these feelings to the isolation that comes with COVID-19 quarantine.  Pt also names neuropathy and overall weakness as reasons that prevent her from coping in ways she typically enjoys (ie. Walking, running errands, etc).  Pt denies any SI/HI or self-harmful behaviors.  Pt reports she will seek help immediately if she begins having thoughts of self-harm.  Pt lives with her dtr and SIL and describes them as very supportive.  Pt reports discussions with dtr as helpful in processing difficult emotions and adding light and hope to her life.  Pt enjoys cooking for them both.  SW validated pt's feelings and offered active listening.  SW provided information on SW role and support services available, highlighting counseling and medication services through [facility].  Pt denies need for formal resources but agrees to reconnect with SW if symptoms worsen.  Pt is agreeable to talking with her PCP about her symptoms at her next visit.  Pt understands to contact 911 in the event of a mental health emergency.  SW provided SW contact information and will remain available if needs arise.

## 2019-09-15 DIAGNOSIS — C9210 Chronic myeloid leukemia, BCR/ABL-positive, not having achieved remission: Principal | ICD-10-CM

## 2019-09-15 MED ORDER — TASIGNA 200 MG CAPSULE
ORAL_CAPSULE | Freq: Every day | ORAL | 3 refills | 30 days | Status: CP
Start: 2019-09-15 — End: ?

## 2019-09-15 NOTE — Unmapped (Signed)
Call to patient to assess the need for refill on their Tasigna (nilotinib).    Pills Remaining: 1 month  Next Cycle Start Date:continuous  Cycle: Daily  Dose: 400 mg  Patient is due for refill at this time.     Side Effects: none at this time  Adherence:No pills missed       Will send updated prescription to Dr. Sanda Klein and Horris Latino, NP for approval.

## 2019-09-16 DIAGNOSIS — C9210 Chronic myeloid leukemia, BCR/ABL-positive, not having achieved remission: Principal | ICD-10-CM

## 2019-09-16 MED ORDER — TASIGNA 200 MG CAPSULE
ORAL_CAPSULE | Freq: Every day | ORAL | 3 refills | 30 days | Status: CP
Start: 2019-09-16 — End: ?

## 2019-09-16 NOTE — Unmapped (Signed)
Call from patient that she heard from [facility] regarding her Tasigna and the high co-pay.  Patient is receiving free drug through [employer] program which requires medication be filled through [facility] [employer].  Will send script to the appropriate pharmacy and call [employer] to inform them that they will no longer be filling this medication for [facility].

## 2019-10-24 DIAGNOSIS — C9210 Chronic myeloid leukemia, BCR/ABL-positive, not having achieved remission: Principal | ICD-10-CM

## 2019-11-01 ENCOUNTER — Ambulatory Visit: Admit: 2019-11-01 | Discharge: 2019-11-01 | Payer: MEDICARE

## 2019-11-01 ENCOUNTER — Ambulatory Visit: Admit: 2019-11-01 | Discharge: 2019-11-01 | Payer: MEDICARE | Attending: Adult Health | Primary: Adult Health

## 2019-11-01 DIAGNOSIS — Z95828 Presence of other vascular implants and grafts: Principal | ICD-10-CM

## 2019-11-01 DIAGNOSIS — C9210 Chronic myeloid leukemia, BCR/ABL-positive, not having achieved remission: Principal | ICD-10-CM

## 2019-11-01 LAB — CBC W/ AUTO DIFF
BASOPHILS ABSOLUTE COUNT: 0 10*9/L (ref 0.0–0.1)
BASOPHILS RELATIVE PERCENT: 0.4 %
EOSINOPHILS ABSOLUTE COUNT: 0.1 10*9/L (ref 0.0–0.7)
EOSINOPHILS RELATIVE PERCENT: 2.2 %
HEMATOCRIT: 31.9 % — ABNORMAL LOW (ref 35.0–44.0)
HEMOGLOBIN: 10.5 g/dL — ABNORMAL LOW (ref 12.0–15.5)
LYMPHOCYTES ABSOLUTE COUNT: 1.8 10*9/L (ref 0.7–4.0)
LYMPHOCYTES RELATIVE PERCENT: 36.1 %
MEAN CORPUSCULAR HEMOGLOBIN: 29.8 pg (ref 26.0–34.0)
MEAN CORPUSCULAR VOLUME: 90.6 fL (ref 82.0–98.0)
MEAN PLATELET VOLUME: 6.6 fL — ABNORMAL LOW (ref 7.0–10.0)
MONOCYTES RELATIVE PERCENT: 10.6 %
NEUTROPHILS ABSOLUTE COUNT: 2.5 10*9/L (ref 1.7–7.7)
NEUTROPHILS RELATIVE PERCENT: 50.7 %
PLATELET COUNT: 187 10*9/L (ref 150–450)
RED BLOOD CELL COUNT: 3.52 10*12/L — ABNORMAL LOW (ref 3.90–5.03)
WBC ADJUSTED: 4.9 10*9/L (ref 3.5–10.5)

## 2019-11-01 LAB — COMPREHENSIVE METABOLIC PANEL
ALBUMIN: 3.6 g/dL (ref 3.5–5.0)
ALKALINE PHOSPHATASE: 100 U/L (ref 50–136)
AST (SGOT): 20 U/L (ref 15–40)
BILIRUBIN TOTAL: 0.8 mg/dL (ref 0.2–1.0)
BLOOD UREA NITROGEN: 20 mg/dL (ref 10–20)
BUN / CREAT RATIO: 11
CALCIUM: 9.1 mg/dL (ref 8.5–10.1)
CHLORIDE: 107 mmol/L (ref 98–107)
CO2: 23.6 mmol/L (ref 21.0–32.0)
CREATININE: 1.85 mg/dL — ABNORMAL HIGH (ref 0.60–1.10)
EGFR CKD-EPI AA FEMALE: 32 mL/min/{1.73_m2}
EGFR CKD-EPI NON-AA FEMALE: 28 mL/min/{1.73_m2}
GLUCOSE RANDOM: 103 mg/dL (ref 70–179)
POTASSIUM: 4.3 mmol/L (ref 3.5–5.0)
PROTEIN TOTAL: 7.8 g/dL (ref 6.0–8.0)
SODIUM: 140 mmol/L (ref 135–145)

## 2019-11-01 LAB — POTASSIUM: Potassium:SCnc:Pt:Ser/Plas:Qn:: 4.3

## 2019-11-01 LAB — VITAMIN B-12: Cobalamins:MCnc:Pt:Ser/Plas:Qn:: 928 — ABNORMAL HIGH

## 2019-11-01 LAB — MEAN PLATELET VOLUME: Platelet mean volume:EntVol:Pt:Bld:Qn:Automated count: 6.6 — ABNORMAL LOW

## 2019-11-01 MED ORDER — LIDOCAINE-PRILOCAINE 2.5 %-2.5 % TOPICAL CREAM
1 refills | 0 days | Status: CP
Start: 2019-11-01 — End: 2020-10-31

## 2019-11-01 NOTE — Unmapped (Signed)
Received message from [facility] that patient is having a difficult time obtaining her Tasigna.  Call to patient to obtain some additional information; however, no answer.  Left voicemail.

## 2019-11-01 NOTE — Unmapped (Signed)
[facility]  Note      Patient Name:  Katherine Hayes  Date of Birth:  10-Sep-1955  Date of Encounter:  11/01/2019    Referring Provider:  Katheran Hayes, *  PCP:  Katherine James, MD      Reason for Visit:   1. 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 She is on a dose reduction of Tasigna 400mg  dailyl d/t anorexia and weight loss.    Plan:  1. CML:     - Tasigna  400mg  daily-tolerating this dose better.    - bcr-abl 4 --> 0.292 since initiation of Tasigna -->.019  2. H/O Hemolytic anemia:    - very extensive work up detailed below. Has been seen at [facility]. Hemolysis was felt to be minimal  3. Diarrhea: C diff negative. Infectious work up negative.  Thought to be related to Gleevec.   - diarrhea resolved following d/c gleevec   - gained weight back  4. Hypertension/HAs-  coreg  25mg  BID. Has been referred to primary care to establish care.  5. Elevated creat 1.85: Decrease ibuprofen usage. Increase PO hydration.     RTC - 3 months        Treatment history:  1. Gleevec- discontinued 04/2018 d/t colitis  2. Tasigna 10/21/18- current    Hematology History:    This is a 64 year old female who was initially diagnosed with CML in 2006.  She reports being on Gleevec for a number of years, but due to financial and insurance issues, she was off therapy from 2009-2013. She was seen by hematologist and initially placed on Hydrea for a rising white count. Once the patient was able to obtain Gleevec, she was started back on it. Over the last 6-12 months, she reports increased fatigue and malaise.  She has not had her blood counts checked since May 2015. Due to financial issues, she had been off gleevec during this time. She started back on gleevec ?around 11/2013. When she was initially seen in [place], I was concerned for primary bone marrow failure (ie CML with accelerated phase or blast crisis), but a bone marrow biopsy on 06/27/14 showed normal hematopoesis, normal cytogenetics and flow. Her bcr-abl was 9% by PCR. On follow up, labs in mid-December showed hemolysis with an undetectable haptoglobin. Interesting, her LDH and bili were normal. G6PD was normal. Hemoglobin electrophoresis was normal expect for a small amount (0.9%) HgbF. More outside records \\were  obtain which indicated that she had had an undetectable haptoglobin as early as 07/2013 suggesting a chronic hemolytic process. Blood smear was again reviewed which showed 3-4 schistocytes per high-power field, c/w a microangiopathic hemolytic anemia.  An ADAMTS13 was sent and was normal. Fibrinogen was normal making DIC unlikely. PNH was sent but the tube was hemolyzed and could not be run. Cold agglutinins negative.      She was hospitalized at [facility] for a GI bleed and diarrhea in 12/2017. She required 2u pRBC. She did not undergo scopes as the bleeding spontaneously ceased. She was set up for EGD/Cscope with Dr. Marin Olp as an outpatient. EGD 03/09/18 showed healing esophagitis and a normal stomach and duodenum. Bx showed reactive gastropathy with neg H pylori. Small bowel bx were negative for features of celiac disease. Colonoscopy findings 03/09/18 described as diffuse mild mucosal changes throughout the entire examine colon concerning for edema/colitis. This was thought to possible be related  to her Gleevec.    She was admitted 04/01/18 due to worsening diarrhea and malabsorption. CT showed ileal wall thickening extending to the level of the terminal ileum. No colonic involvement was noted. Flex sigmoidoscopy 8/30 showed localized mild inflammation in the proximal sigmoid colon. Biopsies were taken which showed normal mucosa. Her gleevec has been held since that hospitalization.     Her symptoms have significantly improved since stopping the gleevec    Interim history:  Pt presents in follow up. She is doing well. Weight up. She denies any visual changes, chest pain, SOB or DOE. No s/s of bleeding.   Tolerating Tasigna 400 mg po daily.     PCR trend:  07/2013: BCR/ABL- PCR 91%  12/2013: BCR/ABL PCR???5.42%   06/2014: BCR/ABL PCR 0.9%   09/20/14: BCR/ABL PCR 15% (had been off gleevec due to count abnormalities)--> gleevec restarted at 400mg   11/22/14- gleevec held for tcp <50  02/22/15: BCR-ABL 4%   9/16- gleevec restarted at 300mg   08/01/15- BCR-ABL 0.161%   03/25/16- BCR-ABL- 0.028 IS %  05/15/16-  BCR-ABL- 0.033 IS %  09/16/16- BCR-ABL- .037%  02/10/2017: BCR-ABL- .021%  05/04/17- BCR-ABL- .027%  07/09/17: BCR-ABL- .025%  01/28/18- BCR-ABL- .023%   07/2018: BCR-ABL- 0.238  07/25/19: BCR-ABL: 0.019      Past Medical History:  1.  CML.  2.  Gout.  3.  Hypertension.  4.  Acid reflux.  5.  Neuropathy    Past Surgical History:      She has a past surgical history that includes Radiation; Chemotherapy; Breast lumpectomy (Left, 1991); Dental surgery; and pr sigmoidoscopy,biopsy (N/A, 04/02/2018).      Allergies:     She is allergic to lisinopril.        Medications:     She has a current medication list which includes the following prescription(s): carvedilol, ferrous sulfate, folic acid, gabapentin, ibuprofen, tasigna, and lidocaine-prilocaine, and the following Facility-Administered Medications: heparin, porcine (pf), heparin, porcine (pf), and heparin, porcine (pf).      Family History:    No family history of malignancy or blood disorders    Social History:     Social History     Socioeconomic History   ??? Marital status: Single     Spouse name: None   ??? Number of children: None   ??? Years of education: None   ??? Highest education level: None   Occupational History     Employer: NOT EMPLOYED   Social Needs   ??? Financial resource strain: None   ??? Food insecurity     Worry: None     Inability: None   ??? Transportation needs     Medical: None     Non-medical: None   Tobacco Use   ??? Smoking status: Never Smoker   ??? Smokeless tobacco: Never Used   Substance and Sexual Activity   ??? Alcohol use: Yes Alcohol/week: 3.0 standard drinks     Types: 3 Glasses of wine per week   ??? Drug use: No   ??? Sexual activity: None   Lifestyle   ??? Physical activity     Days per week: None     Minutes per session: None   ??? Stress: None   Relationships   ??? Social Wellsite geologist on phone: None     Gets together: None     Attends religious service: None     Active member of club or organization: None     Attends meetings  of clubs or organizations: None     Relationship status: None   Other Topics Concern   ??? None   Social History Narrative   ??? None         Review of Systems:   CONSTITUTIONAL: Feels well. NAD  HEENT: Eyes: No visual loss, blurred vision, double vision or yellow sclerae. Ears, Nose, Throat: No hearing loss, sneezing, congestion, runny nose or sore throat.   SKIN: No rash or itching.   CARDIOVASCULAR: No chest pain, chest pressure or chest discomfort. No palpitations or edema.   RESPIRATORY: No shortness of breath, cough or sputum.   GASTROINTESTINAL: Diarrhea. 8x per day  NEUROLOGICAL: no headache, no syncope, paralysis, ataxia,   MUSCULOSKELETAL: No muscle or bone pain; no joint pain or stiffness.   HEMATOLOGIC: +chronic  anemia, no abnormal bleeding or bruising.   LYMPHATICS: No enlarged nodes. No history of splenectomy.   PSYCHIATRIC: No history of depression or anxiety.   ENDOCRINOLOGIC: No reports of sweating, cold or heat intolerance. No polyuria or polydipsia.   ALLERGIES: No history of asthma, hives, eczema or rhinitis.  Neuro: Worsening PN in fingers and toes    Physical Exam:    Vitals: BP 150/75  - Pulse 64  - Temp 36.5 ??C (97.7 ??F) (Temporal)  - Resp 16  - Ht 167.6 cm (5' 5.98)  - Wt 71.8 kg (158 lb 6.4 oz)  - SpO2 98%  - BMI 25.58 kg/m??    General:  Pleasant, no acute distress.    ECOG 1  Eyes:  EOMI, PERRL, sclerae anicteric, conjunctivae pink.    HENT:  NCAT  Respiratory:  Unlabored. No audible wheezing. Pox 98%  Skin:  No rashes present.  MSK: no joint abnormalities  Psychiatric:  Affect appropriate.  Judgment and insight nl.  Neurologic:  Alert and oriented x3.  Grossly non-focal.            Labs:   I personally reviewed the following labs.    Lab Results   Component Value Date    WBC 4.9 11/01/2019    HGB 10.5 (L) 11/01/2019    HCT 31.9 (L) 11/01/2019    PLT 187 11/01/2019       Lab Results   Component Value Date    NA 140 11/01/2019    K 4.3 11/01/2019    CL 107 11/01/2019    CO2 23.6 11/01/2019    BUN 20 11/01/2019    CREATININE 1.85 (H) 11/01/2019    GLU 103 11/01/2019    CALCIUM 9.1 11/01/2019    MG 1.9 02/18/2019    PHOS 2.0 (L) 04/07/2018       Lab Results   Component Value Date    BILITOT 0.8 11/01/2019    BILIDIR 0.80 (H) 12/21/2018    PROT 7.8 11/01/2019    ALBUMIN 3.6 11/01/2019    ALT 27 11/01/2019    AST 20 11/01/2019    ALKPHOS 100 11/01/2019       Lab Results   Component Value Date    LABPROT 13.0 07/11/2014    INR 0.87 (L) 04/06/2018    APTT 26 07/11/2014         Results for orders placed or performed in visit on 11/01/19   Comprehensive Metabolic Panel   Result Value Ref Range    Sodium 140 135 - 145 mmol/L    Potassium 4.3 3.5 - 5.0 mmol/L    Chloride 107 98 - 107 mmol/L    Anion Gap 9 3 -  11 mmol/L    CO2 23.6 21.0 - 32.0 mmol/L    BUN 20 10 - 20 mg/dL    Creatinine 1.61 (H) 0.60 - 1.10 mg/dL    BUN/Creatinine Ratio 11     EGFR CKD-EPI Non-African American, Female 28 mL/min/1.40m2    EGFR CKD-EPI African American, Female 32 mL/min/1.84m2    Glucose 103 70 - 179 mg/dL    Calcium 9.1 8.5 - 09.6 mg/dL    Albumin 3.6 3.5 - 5.0 g/dL    Total Protein 7.8 6.0 - 8.0 g/dL    Total Bilirubin 0.8 0.2 - 1.0 mg/dL    AST 20 15 - 40 U/L    ALT 27 12 - 78 U/L    Alkaline Phosphatase 100 50 - 136 U/L   CBC w/ Differential   Result Value Ref Range    WBC 4.9 3.5 - 10.5 10*9/L    RBC 3.52 (L) 3.90 - 5.03 10*12/L    HGB 10.5 (L) 12.0 - 15.5 g/dL    HCT 04.5 (L) 40.9 - 44.0 %    MCV 90.6 82.0 - 98.0 fL    MCH 29.8 26.0 - 34.0 pg    MCHC 32.9 30.0 - 36.0 g/dL    RDW 81.1 (H) 91.4 - 15.0 %    MPV 6.6 (L) 7.0 - 10.0 fL    Platelet 187 150 - 450 10*9/L    Neutrophils % 50.7 %    Lymphocytes % 36.1 %    Monocytes % 10.6 %    Eosinophils % 2.2 %    Basophils % 0.4 %    Absolute Neutrophils 2.5 1.7 - 7.7 10*9/L    Absolute Lymphocytes 1.8 0.7 - 4.0 10*9/L    Absolute Monocytes 0.5 0.1 - 1.0 10*9/L    Absolute Eosinophils 0.1 0.0 - 0.7 10*9/L    Absolute Basophils 0.0 0.0 - 0.1 10*9/L       Horris Latino, NP

## 2019-11-07 NOTE — Unmapped (Signed)
Received VM from [place] returning my call indicating that she has still not received her Tasigna.  Call back to [facility] and she has not had anyone from [facility] reach out to her to set up delivery.  She denied reaching out to the pharmacy to indicate that she needs a refill.  Provided her the contact information for the pharmacy and informed her that she would need to call them to set up delivery as they will not allow me to do that for the patient.  She stated she would call them and let me know if there are any issues.

## 2020-01-09 DIAGNOSIS — C9210 Chronic myeloid leukemia, BCR/ABL-positive, not having achieved remission: Principal | ICD-10-CM

## 2020-01-09 MED ORDER — TASIGNA 200 MG CAPSULE
ORAL_CAPSULE | Freq: Every day | ORAL | 3 refills | 30.00000 days | Status: CP
Start: 2020-01-09 — End: ?

## 2020-01-09 NOTE — Unmapped (Signed)
Call to patient to assess the need for refill on their Tasigna (nilotinib).    Pills Remaining: 1 month  Next Cycle Start Date:continuous  Cycle: Daily  Dose: 400 mg  Patient is due for refill at this time.     Side Effects: none at this time  Adherence:unknown- LVM       Will send updated prescription to Dr. Sanda Klein and Horris Latino, NP for approval.

## 2020-01-30 DIAGNOSIS — C9210 Chronic myeloid leukemia, BCR/ABL-positive, not having achieved remission: Principal | ICD-10-CM

## 2020-02-01 ENCOUNTER — Ambulatory Visit: Admit: 2020-02-01 | Payer: MEDICARE

## 2020-02-01 ENCOUNTER — Ambulatory Visit
Admit: 2020-02-01 | Payer: MEDICARE | Attending: Student in an Organized Health Care Education/Training Program | Primary: Student in an Organized Health Care Education/Training Program

## 2020-04-17 DIAGNOSIS — C9210 Chronic myeloid leukemia, BCR/ABL-positive, not having achieved remission: Principal | ICD-10-CM

## 2020-04-17 MED ORDER — GABAPENTIN 300 MG CAPSULE
ORAL_CAPSULE | 3 refills | 0 days | Status: CP
Start: 2020-04-17 — End: ?

## 2020-04-17 NOTE — Unmapped (Signed)
Please approve gaba

## 2020-04-24 NOTE — Unmapped (Signed)
Call placed to patient to scheduled labs/exam with Dr. Sanda Klein or Horris Latino, NP.  Patient was to RTC in 3 months based on office note from Horris Latino, NP from 11/01/19.  Patient had labs/exam scheduled on 02/01/20 and was a No Show for both appointment.  Have been unable to reach patient regarding her Tasigna since April of 2021.  Patient will need to scheduled an appointment for labs/exam prior to refills being placed.  LVM indicating that patient needs to have appointment for labs/exam so that we can get refills placed for her.  Direct contact number provided.

## 2020-04-27 NOTE — Unmapped (Signed)
Call placed to patient to attempt to get her scheduled for follow up appointment.  Unable to place refills for her Tasigna until she is seen in clinic.  No answer.

## 2020-05-01 NOTE — Unmapped (Signed)
Third attempt to reach patient re: Tasigna refill and the need for lab/exam prior.  No answer.  Left voicemail with both my contact information as well as Care Coordinator Josefa Half, RN contact information.

## 2020-05-15 DIAGNOSIS — C9210 Chronic myeloid leukemia, BCR/ABL-positive, not having achieved remission: Principal | ICD-10-CM

## 2020-05-15 NOTE — Unmapped (Signed)
Received return call from patient.  Called patient back.  She states that she has been in a depression and that is the reason that she missed her appointment back in June.  She states I dont know what I should be doing right now when talking about her Tasigna.  Provided encouragement to patient and informed her that we would get her labs checked and then go from there.  Appointment made for tomorrow for labs/exam.  Patient agreed to discuss social distress/depression with SW.  Referral made to SW.

## 2020-05-16 ENCOUNTER — Ambulatory Visit: Admit: 2020-05-16 | Payer: MEDICARE

## 2020-05-16 DIAGNOSIS — C9210 Chronic myeloid leukemia, BCR/ABL-positive, not having achieved remission: Principal | ICD-10-CM

## 2020-05-16 NOTE — Unmapped (Signed)
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 LCSW spoke with patient by phone on 05/16/2020. Patient was alert and oriented x4 (self, place, time, situation) and engaged readily in conversation. Patient was referred to LCSW by Lin Landsman RN to address needs related to Transportation.    Narrative: LCSW spoke with patient this morning regarding transportation to this morning's appointment. LCSW had scheduled [employer] for patient, but patient requested LCSW cancel it as she had procured her own transportation.     Patient did state she would like to speak with LCSW regarding some other concerns. LCSW will plan to meet with patient following her visit with provider this morning in the office.       Plan for Follow-Up:  LCSW will continue to be available for psychosocial/emotional support, counseling and resource assistance as needed/appropriate.            05/16/2020 8:49 AM  -----------------------------------------  Alfredo Martinez C. No?l, MSW, LCSW  Oncology Social Worker  [facility]  [facility] [place]  [phone]

## 2020-05-21 ENCOUNTER — Ambulatory Visit: Admit: 2020-05-21 | Discharge: 2020-05-22 | Payer: MEDICARE

## 2020-05-21 ENCOUNTER — Ambulatory Visit: Admit: 2020-05-21 | Discharge: 2020-05-21 | Payer: MEDICARE

## 2020-05-21 ENCOUNTER — Other Ambulatory Visit: Admit: 2020-05-21 | Discharge: 2020-05-21 | Payer: MEDICARE

## 2020-05-21 ENCOUNTER — Ambulatory Visit: Admit: 2020-05-21 | Discharge: 2020-05-21 | Payer: MEDICARE | Attending: Adult Health | Primary: Adult Health

## 2020-05-21 DIAGNOSIS — D518 Other vitamin B12 deficiency anemias: Principal | ICD-10-CM

## 2020-05-21 DIAGNOSIS — D508 Other iron deficiency anemias: Principal | ICD-10-CM

## 2020-05-21 DIAGNOSIS — C9210 Chronic myeloid leukemia, BCR/ABL-positive, not having achieved remission: Principal | ICD-10-CM

## 2020-05-21 DIAGNOSIS — R7989 Other specified abnormal findings of blood chemistry: Principal | ICD-10-CM

## 2020-05-21 DIAGNOSIS — R5382 Chronic fatigue, unspecified: Principal | ICD-10-CM

## 2020-05-21 DIAGNOSIS — R635 Abnormal weight gain: Principal | ICD-10-CM

## 2020-05-21 DIAGNOSIS — D638 Anemia in other chronic diseases classified elsewhere: Principal | ICD-10-CM

## 2020-05-21 LAB — FREE T4: Chemistry studies:Cmplx:-:^Patient:Set:: 1.39

## 2020-05-21 LAB — COMPREHENSIVE METABOLIC PANEL
ALBUMIN: 3.6 g/dL (ref 3.5–5.0)
ALKALINE PHOSPHATASE: 107 U/L (ref 50–136)
ALT (SGPT): 20 U/L (ref 12–78)
ANION GAP: 12 mmol/L — ABNORMAL HIGH (ref 3–11)
AST (SGOT): 16 U/L (ref 15–40)
BILIRUBIN TOTAL: 1 mg/dL (ref 0.2–1.0)
BLOOD UREA NITROGEN: 19 mg/dL (ref 10–20)
CALCIUM: 8.6 mg/dL (ref 8.5–10.1)
CHLORIDE: 110 mmol/L — ABNORMAL HIGH (ref 98–107)
CO2: 20.2 mmol/L — ABNORMAL LOW (ref 21.0–32.0)
EGFR CKD-EPI AA FEMALE: 37 mL/min/{1.73_m2}
EGFR CKD-EPI NON-AA FEMALE: 32 mL/min/{1.73_m2}
GLUCOSE RANDOM: 85 mg/dL (ref 70–179)
POTASSIUM: 4.2 mmol/L (ref 3.5–5.0)
PROTEIN TOTAL: 7.5 g/dL (ref 6.0–8.0)
SODIUM: 142 mmol/L (ref 135–145)

## 2020-05-21 LAB — CBC W/ AUTO DIFF
BASOPHILS ABSOLUTE COUNT: 0 10*9/L (ref 0.0–0.1)
BASOPHILS RELATIVE PERCENT: 0.3 %
EOSINOPHILS ABSOLUTE COUNT: 0.1 10*9/L (ref 0.0–0.7)
EOSINOPHILS RELATIVE PERCENT: 1.1 %
HEMATOCRIT: 33.5 % — ABNORMAL LOW (ref 35.0–44.0)
HEMOGLOBIN: 11 g/dL — ABNORMAL LOW (ref 12.0–15.5)
LYMPHOCYTES ABSOLUTE COUNT: 1.4 10*9/L (ref 0.7–4.0)
LYMPHOCYTES RELATIVE PERCENT: 23.2 %
MEAN CORPUSCULAR HEMOGLOBIN CONC: 33 g/dL (ref 30.0–36.0)
MEAN CORPUSCULAR HEMOGLOBIN: 32 pg (ref 26.0–34.0)
MEAN CORPUSCULAR VOLUME: 97.3 fL (ref 82.0–98.0)
MONOCYTES ABSOLUTE COUNT: 0.5 10*9/L (ref 0.1–1.0)
MONOCYTES RELATIVE PERCENT: 9.2 %
NEUTROPHILS RELATIVE PERCENT: 66.2 %
PLATELET COUNT: 173 10*9/L (ref 150–450)
RED BLOOD CELL COUNT: 3.44 10*12/L — ABNORMAL LOW (ref 3.90–5.03)
WBC ADJUSTED: 5.9 10*9/L (ref 3.5–10.5)

## 2020-05-21 LAB — IRON & TIBC: IRON: 72 ug/dL (ref 65–175)

## 2020-05-21 LAB — NEUTROPHILS RELATIVE PERCENT: Neutrophils/100 leukocytes:NFr:Pt:Bld:Qn:Automated count: 66.2

## 2020-05-21 LAB — VITAMIN B-12: Cobalamins:MCnc:Pt:Ser/Plas:Qn:: 475

## 2020-05-21 LAB — FERRITIN: Chemistry studies:Cmplx:-:^Patient:Set:: 101.8

## 2020-05-21 LAB — EGFR CKD-EPI NON-AA FEMALE
Glomerular filtration rate/1.73 sq M.predicted.non black:ArVRat:Pt:Ser/Plas/Bld:Qn:Creatinine-based formula (CKD-EPI): 32

## 2020-05-21 LAB — THYROID STIMULATING HORMONE: Chemistry studies:Cmplx:-:^Patient:Set:: 2.282

## 2020-05-21 LAB — IRON: Iron:MCnc:Pt:Ser/Plas:Qn:: 72

## 2020-05-21 MED ADMIN — heparin preservative-free injection 10 units/mL syringe (HEPARIN LOCK FLUSH): 50 [IU] | INTRAVENOUS | @ 13:00:00 | Stop: 2020-05-21

## 2020-05-21 NOTE — Unmapped (Signed)
[facility]  Note      Patient Name:  Katherine Hayes  Date of Birth:  17-Jun-1956  Date of Encounter:  05/21/2020    Referring Provider:  Katheran James, *  PCP:  Katheran James, MD      Reason for Visit:   1. 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 She is on a dose reduction of Tasigna 400mg  dailyl d/t anorexia and weight loss.Tolerating this well.     Plan:  1. CML:     - Tasigna  400mg  daily-tolerating this dose better.    - bcr-abl 4 --> 0.292 since initiation of Tasigna -->PENDING  2. H/O Hemolytic anemia:    - very extensive work up detailed below. Has been seen at [facility]. Hemolysis was felt to be minimal  3. Diarrhea: RESOLVED-C diff negative. Infectious work up negative.  Thought to be related to Gleevec.   - diarrhea resolved following d/c gleevec   - gained weight back  4. Hypertension/HAs-  coreg  25mg  BID. Has been referred to primary care to establish care.  5. Elevated creat -improved today to 1.65: Decrease ibuprofen usage. Increase PO hydration.   6. Situational depression-= referred to SW.  7. Chronic PN - continue gabapentin BID.  8. Anemia- hgb 11. check B12 and iron. Has been on supplements in the past.   9. Fatigue- check TSH    RTC - 3 months for port flush, labs and MD visit        Treatment history:  1. Gleevec- discontinued 04/2018 d/t colitis  2. Tasigna 10/21/18- current    Hematology History:    This is a 64 year old female who was initially diagnosed with CML in 2006.  She reports being on Gleevec for a number of years, but due to financial and insurance issues, she was off therapy from 2009-2013. She was seen by hematologist and initially placed on Hydrea for a rising white count. Once the patient was able to obtain Gleevec, she was started back on it. Over the last 6-12 months, she reports increased fatigue and malaise.  She has not had her blood counts checked since May 2015. Due to financial issues, she had been off gleevec during this time. She started back on gleevec ?around 11/2013. When she was initially seen in [place], I was concerned for primary bone marrow failure (ie CML with accelerated phase or blast crisis), but a bone marrow biopsy on 06/27/14 showed normal hematopoesis, normal cytogenetics and flow. Her bcr-abl was 9% by PCR. On follow up, labs in mid-December showed hemolysis with an undetectable haptoglobin. Interesting, her LDH and bili were normal. G6PD was normal. Hemoglobin electrophoresis was normal expect for a small amount (0.9%) HgbF. More outside records \\were  obtain which indicated that she had had an undetectable haptoglobin as early as 07/2013 suggesting a chronic hemolytic process. Blood smear was again reviewed which showed 3-4 schistocytes per high-power field, c/w a microangiopathic hemolytic anemia.  An ADAMTS13 was sent and was normal. Fibrinogen was normal making DIC unlikely. PNH was sent but the tube was hemolyzed and could not be run. Cold agglutinins negative.      She was hospitalized at [facility] for a GI bleed and diarrhea in 12/2017. She required 2u pRBC. She did not undergo scopes as the bleeding spontaneously ceased. She was set up for EGD/Cscope with Dr. Marin Olp as an outpatient. EGD 03/09/18 showed  healing esophagitis and a normal stomach and duodenum. Bx showed reactive gastropathy with neg H pylori. Small bowel bx were negative for features of celiac disease. Colonoscopy findings 03/09/18 described as diffuse mild mucosal changes throughout the entire examine colon concerning for edema/colitis. This was thought to possible be related to her Gleevec.    She was admitted 04/01/18 due to worsening diarrhea and malabsorption. CT showed ileal wall thickening extending to the level of the terminal ileum. No colonic involvement was noted. Flex sigmoidoscopy 8/30 showed localized mild inflammation in the proximal sigmoid colon. Biopsies were taken which showed normal mucosa. Her gleevec has been held since that hospitalization.     Her symptoms have significantly improved since stopping the gleevec    Interim history:  Pt presents in follow up. Her last visit here was 10/2019.  She denies any visual changes, chest pain, SOB or DOE. No s/s of bleeding. She has a port which was flushed today. It appears to be working well.   Tolerating Tasigna 400 mg po daily. Reports feeling more depressed due to covid restrictions and feeling isolated. She has been contacted by SW for support and I encouraged her to come in for an appt. for therapy as needed.     Lab work is pending currently.   Reporting PN in hands and feet. Recently restarted gabapentin 600 mg BID.     PCR trend:  07/2013: BCR/ABL- PCR 91%  12/2013: BCR/ABL PCR???5.42%   06/2014: BCR/ABL PCR 0.9%   09/20/14: BCR/ABL PCR 15% (had been off gleevec due to count abnormalities)--> gleevec restarted at 400mg   11/22/14- gleevec held for tcp <50  02/22/15: BCR-ABL 4%   9/16- gleevec restarted at 300mg   08/01/15- BCR-ABL 0.161%   03/25/16- BCR-ABL- 0.028 IS %  05/15/16-  BCR-ABL- 0.033 IS %  09/16/16- BCR-ABL- .037%  02/10/2017: BCR-ABL- .021%  05/04/17- BCR-ABL- .027%  07/09/17: BCR-ABL- .025%  01/28/18- BCR-ABL- .023%   07/2018: BCR-ABL- 0.238  07/25/19: BCR-ABL: 0.019  05/21/20: BCR-ABL:      Past Medical History:  1.  CML.  2.  Gout.  3.  Hypertension.  4.  Acid reflux.  5.  Neuropathy    Past Surgical History:      She has a past surgical history that includes Radiation; Chemotherapy; Breast lumpectomy (Left, 1991); Dental surgery; and pr sigmoidoscopy,biopsy (N/A, 04/02/2018).      Allergies:     She is allergic to lisinopril.        Medications:     She has a current medication list which includes the following prescription(s): carvedilol, folic acid, gabapentin, ibuprofen, lidocaine-prilocaine, and tasigna, and the following Facility-Administered Medications: heparin, porcine (pf) and heparin, porcine (pf).      Family History:    No family history of malignancy or blood disorders    Social History:     Social History     Socioeconomic History   ??? Marital status: Single     Spouse name: None   ??? Number of children: None   ??? Years of education: None   ??? Highest education level: None   Occupational History     Employer: NOT EMPLOYED   Tobacco Use   ??? Smoking status: Never Smoker   ??? Smokeless tobacco: Never Used   Vaping Use   ??? Vaping Use: Never used   Substance and Sexual Activity   ??? Alcohol use: Yes     Alcohol/week: 3.0 standard drinks     Types: 3 Glasses of wine per week   ???  Drug use: No   ??? Sexual activity: None   Other Topics Concern   ??? None   Social History Narrative   ??? None     Social Determinants of Health     Financial Resource Strain:    ??? Difficulty of Paying Living Expenses:    Food Insecurity:    ??? Worried About Programme researcher, broadcasting/film/video in the Last Year:    ??? Barista in the Last Year:    Transportation Needs:    ??? Freight forwarder (Medical):    ??? Lack of Transportation (Non-Medical):    Physical Activity:    ??? Days of Exercise per Week:    ??? Minutes of Exercise per Session:    Stress:    ??? Feeling of Stress :    Social Connections:    ??? Frequency of Communication with Friends and Family:    ??? Frequency of Social Gatherings with Friends and Family:    ??? Attends Religious Services:    ??? Database administrator or Organizations:    ??? Attends Engineer, structural:    ??? Marital Status:          Review of Systems:   CONSTITUTIONAL: Feels well. Some situational depression.   HEENT: Eyes: No visual loss, blurred vision, double vision or yellow sclerae. Ears, Nose, Throat: No hearing loss, sneezing, congestion, runny nose or sore throat.   SKIN: No rash or itching.   CARDIOVASCULAR: No chest pain, chest pressure or chest discomfort. No palpitations or edema.   RESPIRATORY: No shortness of breath, cough or sputum.   GASTROINTESTINAL:No constipation, diarrhea.   NEUROLOGICAL: no headache, no syncope, paralysis, ataxia,   MUSCULOSKELETAL: No muscle or bone pain; no joint pain or stiffness.   HEMATOLOGIC: +chronic  anemia, no abnormal bleeding or bruising.   LYMPHATICS: No enlarged nodes. No history of splenectomy.   PSYCHIATRIC: No history of depression or anxiety.   ENDOCRINOLOGIC: No reports of sweating, cold or heat intolerance. No polyuria or polydipsia.   ALLERGIES: No history of asthma, hives, eczema or rhinitis.  Neuro: Worsening PN in fingers and toes    Physical Exam:    Vitals: BP 144/69  - Pulse 72  - Temp 36.7 ??C (98 ??F) (Temporal)  - Resp 16  - Ht 167.6 cm (5' 5.98)  - Wt 70.9 kg (156 lb 3.2 oz)  - SpO2 98%  - BMI 25.22 kg/m??    General:  Pleasant, no acute distress.    ECOG 1  Eyes:  EOMI, PERRL, sclerae anicteric, conjunctivae pink.    HENT:  NCAT  Respiratory:  Unlabored. No audible wheezing. Pox 98%  Skin:  No rashes present.  MSK: no joint abnormalities  Psychiatric:  Affect appropriate.  Judgment and insight nl.  Neurologic:  Alert and oriented x3.  Grossly non-focal.            Labs:   I personally reviewed the following labs.    Lab Results   Component Value Date    WBC 5.9 05/21/2020    HGB 11.0 (L) 05/21/2020    HCT 33.5 (L) 05/21/2020    PLT 173 05/21/2020       Lab Results   Component Value Date    NA 142 05/21/2020    K 4.2 05/21/2020    CL 110 (H) 05/21/2020    CO2 20.2 (L) 05/21/2020    BUN 19 05/21/2020    CREATININE 1.65 (H) 05/21/2020  GLU 85 05/21/2020    CALCIUM 8.6 05/21/2020    MG 1.9 02/18/2019    PHOS 2.0 (L) 04/07/2018       Lab Results   Component Value Date    BILITOT 1.0 05/21/2020    BILIDIR 0.80 (H) 12/21/2018    PROT 7.5 05/21/2020    ALBUMIN 3.6 05/21/2020    ALT 20 05/21/2020    AST 16 05/21/2020    ALKPHOS 107 05/21/2020       Lab Results   Component Value Date    LABPROT 13.0 07/11/2014    INR 0.87 (L) 04/06/2018    APTT 26 07/11/2014         Results for orders placed or performed in visit on 05/21/20   Comprehensive Metabolic Panel   Result Value Ref Range Sodium 142 135 - 145 mmol/L    Potassium 4.2 3.5 - 5.0 mmol/L    Chloride 110 (H) 98 - 107 mmol/L    Anion Gap 12 (H) 3 - 11 mmol/L    CO2 20.2 (L) 21.0 - 32.0 mmol/L    BUN 19 10 - 20 mg/dL    Creatinine 1.61 (H) 0.60 - 1.10 mg/dL    BUN/Creatinine Ratio 12     EGFR CKD-EPI Non-African American, Female 32 mL/min/1.16m2    EGFR CKD-EPI African American, Female 37 mL/min/1.1m2    Glucose 85 70 - 179 mg/dL    Calcium 8.6 8.5 - 09.6 mg/dL    Albumin 3.6 3.5 - 5.0 g/dL    Total Protein 7.5 6.0 - 8.0 g/dL    Total Bilirubin 1.0 0.2 - 1.0 mg/dL    AST 16 15 - 40 U/L    ALT 20 12 - 78 U/L    Alkaline Phosphatase 107 50 - 136 U/L   CBC w/ Differential   Result Value Ref Range    WBC 5.9 3.5 - 10.5 10*9/L    RBC 3.44 (L) 3.90 - 5.03 10*12/L    HGB 11.0 (L) 12.0 - 15.5 g/dL    HCT 04.5 (L) 40.9 - 44.0 %    MCV 97.3 82.0 - 98.0 fL    MCH 32.0 26.0 - 34.0 pg    MCHC 33.0 30.0 - 36.0 g/dL    RDW 81.1 91.4 - 78.2 %    MPV 7.0 7.0 - 10.0 fL    Platelet 173 150 - 450 10*9/L    Neutrophils % 66.2 %    Lymphocytes % 23.2 %    Monocytes % 9.2 %    Eosinophils % 1.1 %    Basophils % 0.3 %    Absolute Neutrophils 3.9 1.7 - 7.7 10*9/L    Absolute Lymphocytes 1.4 0.7 - 4.0 10*9/L    Absolute Monocytes 0.5 0.1 - 1.0 10*9/L    Absolute Eosinophils 0.1 0.0 - 0.7 10*9/L    Absolute Basophils 0.0 0.0 - 0.1 10*9/L       Horris Latino, NP

## 2020-05-25 DIAGNOSIS — C9210 Chronic myeloid leukemia, BCR/ABL-positive, not having achieved remission: Principal | ICD-10-CM

## 2020-05-25 MED ORDER — NILOTINIB 200 MG CAPSULE
ORAL_CAPSULE | Freq: Every day | ORAL | 2 refills | 30 days | Status: CP
Start: 2020-05-25 — End: ?

## 2020-05-25 NOTE — Unmapped (Signed)
Received request for Tasigna (nilotinib) from [facility] by [employer] through pharmacy interface.  Last office note from Horris Latino, NP on 05/21/20 indicates patient should continue Tasigna 400 mg daily and RTC in 3 months. No follow-up appointment made.  Will have front desk reach out to schedule appointment.     Will send script to Dr. Sanda Klein and Horris Latino, NP for signature/approval for refill.

## 2020-06-26 DIAGNOSIS — C9210 Chronic myeloid leukemia, BCR/ABL-positive, not having achieved remission: Principal | ICD-10-CM

## 2020-07-04 MED ORDER — CARVEDILOL 25 MG TABLET
ORAL_TABLET | Freq: Two times a day (BID) | ORAL | 3 refills | 0 days
Start: 2020-07-04 — End: ?

## 2020-07-06 MED ORDER — CARVEDILOL 25 MG TABLET
ORAL_TABLET | Freq: Two times a day (BID) | ORAL | 1 refills | 90 days | Status: CP
Start: 2020-07-06 — End: 2021-07-06

## 2020-07-06 NOTE — Unmapped (Signed)
Refill request for Coreg received via electronic pharmacy interface. Refill request sent to pharmacy per policy/VO from Dr. Sanda Klein.    Last Refill: 07/26/19  Last Appointment: 05/21/20

## 2020-07-19 DIAGNOSIS — C9210 Chronic myeloid leukemia, BCR/ABL-positive, not having achieved remission: Principal | ICD-10-CM

## 2020-07-20 MED ORDER — NILOTINIB 200 MG CAPSULE
ORAL_CAPSULE | Freq: Every day | ORAL | 6 refills | 30 days | Status: CP
Start: 2020-07-20 — End: ?

## 2020-07-20 NOTE — Unmapped (Signed)
Patient no longer has refills for Tasigna on file with [facility] by [employer].  Submitting refills for signature/routing to pharmacy so patient will not have lapse in drug administration/compliance.

## 2020-08-21 DIAGNOSIS — D518 Other vitamin B12 deficiency anemias: Principal | ICD-10-CM

## 2020-08-21 DIAGNOSIS — C9210 Chronic myeloid leukemia, BCR/ABL-positive, not having achieved remission: Principal | ICD-10-CM

## 2020-09-21 ENCOUNTER — Ambulatory Visit
Admit: 2020-09-21 | Payer: MEDICARE | Attending: Student in an Organized Health Care Education/Training Program | Primary: Student in an Organized Health Care Education/Training Program

## 2020-09-21 ENCOUNTER — Ambulatory Visit: Admit: 2020-09-21 | Payer: MEDICARE

## 2020-12-27 MED ORDER — CARVEDILOL 25 MG TABLET
ORAL_TABLET | 1 refills | 0 days
Start: 2020-12-27 — End: ?

## 2020-12-28 MED ORDER — CARVEDILOL 25 MG TABLET
ORAL_TABLET | 1 refills | 0 days | Status: CP
Start: 2020-12-28 — End: ?

## 2020-12-28 NOTE — Unmapped (Signed)
Refill request for Coreg received via electronic pharmacy interface. Refill request sent to pharmacy per policy/VO from Dr. Sanda Klein.    Last Refill: 07/06/20  Last Appointment: 05/21/20 (needs appointment, request made)

## 2021-01-02 DIAGNOSIS — C9210 Chronic myeloid leukemia, BCR/ABL-positive, not having achieved remission: Principal | ICD-10-CM

## 2021-01-02 MED ORDER — NILOTINIB 200 MG CAPSULE
ORAL_CAPSULE | Freq: Every day | ORAL | 6 refills | 30 days | Status: CP
Start: 2021-01-02 — End: ?

## 2021-01-02 NOTE — Unmapped (Signed)
Patient has no further refills of Tasigna on file with [facility].  Please approve.

## 2021-01-04 NOTE — Unmapped (Signed)
Called pt and left message to schedule follow up visit with Dr. Sanda Klein or Angelique Blonder.

## 2021-01-21 MED ORDER — CARVEDILOL 25 MG TABLET
ORAL_TABLET | 1 refills | 0 days | Status: CP
Start: 2021-01-21 — End: ?

## 2021-02-04 MED ORDER — CARVEDILOL 25 MG TABLET
ORAL_TABLET | 1 refills | 0 days
Start: 2021-02-04 — End: ?

## 2021-02-05 MED ORDER — CARVEDILOL 25 MG TABLET
ORAL_TABLET | 1 refills | 0 days | Status: CP
Start: 2021-02-05 — End: ?

## 2021-02-05 NOTE — Unmapped (Signed)
Please refill carvedilol, if approprate.    Thank you,  Randa Evens

## 2021-02-07 DIAGNOSIS — C9210 Chronic myeloid leukemia, BCR/ABL-positive, not having achieved remission: Principal | ICD-10-CM

## 2021-02-07 MED ORDER — GABAPENTIN 300 MG CAPSULE
ORAL_CAPSULE | 3 refills | 0 days | Status: CP
Start: 2021-02-07 — End: ?

## 2021-02-07 NOTE — Unmapped (Signed)
Please refill her neurontin

## 2021-03-01 NOTE — Unmapped (Signed)
Oral Chemotherapy: Tasigna (nilotinib)   Dose: 400 mg  Frequency: Daily    Specialty Pharmacy: [facility]     Financial Assistance: [facility] Free Drug    Last Refill Date: 01/02/21  Number of refills sent: 6 with 30 day supply    Other Pertinent Info: None    PA Dates:NA

## 2021-06-25 DIAGNOSIS — C9210 Chronic myeloid leukemia, BCR/ABL-positive, not having achieved remission: Principal | ICD-10-CM

## 2021-08-03 MED ORDER — CARVEDILOL 25 MG TABLET
ORAL_TABLET | 1 refills | 0 days
Start: 2021-08-03 — End: ?

## 2021-08-06 MED ORDER — CARVEDILOL 25 MG TABLET
ORAL_TABLET | 1 refills | 0 days | Status: CP
Start: 2021-08-06 — End: ?

## 2021-09-19 NOTE — Unmapped (Signed)
Information received from MAP team that Tasigna re-enrollment for patient is dependent on her sending proof of income document to [employer] patient assistance program.  MAP team has attempted to contact patient more than 8 times via telephone and email.     I placed call to patient and daughter's telephones, received voicemail on both.   Left message regarding the above on patient's voicemail.   Sent follow-up MyChart message to patient regarding same.

## 2022-02-02 MED ORDER — CARVEDILOL 25 MG TABLET
ORAL_TABLET | 1 refills | 0 days
Start: 2022-02-02 — End: ?

## 2022-02-03 MED ORDER — CARVEDILOL 25 MG TABLET
ORAL_TABLET | 1 refills | 0 days | Status: CP
Start: 2022-02-03 — End: ?

## 2022-03-19 DIAGNOSIS — C9210 Chronic myeloid leukemia, BCR/ABL-positive, not having achieved remission: Principal | ICD-10-CM

## 2022-03-19 MED ORDER — GABAPENTIN 300 MG CAPSULE
ORAL_CAPSULE | 3 refills | 0 days | Status: CP
Start: 2022-03-19 — End: ?

## 2022-06-18 MED ORDER — CARVEDILOL 25 MG TABLET
ORAL_TABLET | 1 refills | 0 days
Start: 2022-06-18 — End: ?

## 2022-08-13 MED ORDER — CARVEDILOL 25 MG TABLET
ORAL_TABLET | 1 refills | 0 days
Start: 2022-08-13 — End: ?

## 2022-08-14 NOTE — Unmapped (Signed)
Received message from provider that she received a refill request for patient's Carvedilol, however it was noted patient has not been seen in clinic since 05/21/20. Provider asked for me to look into this and call and check on patient.     Per chart review patient was scheduled for follow up on 09/21/20, but was a no show to the appointment. There are notes from Berniece Andreas, CPP and well as the [facility] MAPS team trying to get in touch with patient to renew patient assistance for her Avis Epley, but messages and calls were not returned. Review of Care Everywhere does not reveal any recent appointments at other institutions.     Attempted to call patient. Left VM requesting patient call with an update and schedule an appointment for follow up.     Update to provider

## 2022-08-15 MED ORDER — CARVEDILOL 25 MG TABLET
ORAL_TABLET | 1 refills | 0 days
Start: 2022-08-15 — End: ?
